# Patient Record
Sex: Male | Born: 1985 | Race: Black or African American | Hispanic: No | Marital: Single | Smoking: Current every day smoker
Health system: Southern US, Community
[De-identification: ages and names within clinical notes are randomized; demographics above are authoritative.]

## PROBLEM LIST (undated history)

## (undated) DIAGNOSIS — K8020 Calculus of gallbladder without cholecystitis without obstruction: Secondary | ICD-10-CM

## (undated) DIAGNOSIS — J45909 Unspecified asthma, uncomplicated: Secondary | ICD-10-CM

---

## 2007-04-12 ENCOUNTER — Emergency Department: Payer: Self-pay | Admitting: Emergency Medicine

## 2009-08-16 ENCOUNTER — Emergency Department: Payer: Self-pay | Admitting: Emergency Medicine

## 2010-05-10 ENCOUNTER — Emergency Department: Payer: Self-pay | Admitting: Emergency Medicine

## 2010-05-14 ENCOUNTER — Emergency Department: Payer: Self-pay | Admitting: Emergency Medicine

## 2017-04-09 ENCOUNTER — Emergency Department: Payer: Self-pay

## 2017-04-09 ENCOUNTER — Emergency Department
Admission: EM | Admit: 2017-04-09 | Discharge: 2017-04-09 | Disposition: A | Discharge status: Home / Self Care | Payer: Self-pay | Attending: Student in an Organized Health Care Education/Training Program | Admitting: Student in an Organized Health Care Education/Training Program

## 2017-04-09 DIAGNOSIS — F17200 Nicotine dependence, unspecified, uncomplicated: Secondary | ICD-10-CM | POA: Insufficient documentation

## 2017-04-09 DIAGNOSIS — J45909 Unspecified asthma, uncomplicated: Secondary | ICD-10-CM | POA: Insufficient documentation

## 2017-04-09 DIAGNOSIS — R1011 Right upper quadrant pain: Secondary | ICD-10-CM

## 2017-04-09 DIAGNOSIS — R1013 Epigastric pain: Secondary | ICD-10-CM

## 2017-04-09 HISTORY — DX: Unspecified asthma, uncomplicated: J45.909

## 2017-04-09 HISTORY — DX: Calculus of gallbladder without cholecystitis without obstruction: K80.20

## 2017-04-09 LAB — CBC WITH DIFFERENTIAL/PLATELET
BASOS ABS: 0 10*3/uL (ref 0–0.1)
BASOS PCT: 0 %
EOS ABS: 0 10*3/uL (ref 0–0.7)
Eosinophils Relative: 0 %
HEMATOCRIT: 46.5 % (ref 40.0–52.0)
Hemoglobin: 15.9 g/dL (ref 13.0–18.0)
Lymphocytes Relative: 5 %
Lymphs Abs: 0.9 10*3/uL — ABNORMAL LOW (ref 1.0–3.6)
MCH: 33 pg (ref 26.0–34.0)
MCHC: 34.2 g/dL (ref 32.0–36.0)
MCV: 96.7 fL (ref 80.0–100.0)
MONO ABS: 0.6 10*3/uL (ref 0.2–1.0)
Monocytes Relative: 3 %
NEUTROS PCT: 92 %
Neutro Abs: 17.6 10*3/uL — ABNORMAL HIGH (ref 1.4–6.5)
Platelets: 230 10*3/uL (ref 150–440)
RBC: 4.81 MIL/uL (ref 4.40–5.90)
RDW: 13.4 % (ref 11.5–14.5)
WBC: 19.1 10*3/uL — ABNORMAL HIGH (ref 3.8–10.6)

## 2017-04-09 LAB — COMPREHENSIVE METABOLIC PANEL
ALBUMIN: 4.8 g/dL (ref 3.5–5.0)
ALT: 22 U/L (ref 17–63)
AST: 43 U/L — AB (ref 15–41)
Alkaline Phosphatase: 79 U/L (ref 38–126)
Anion gap: 15 (ref 5–15)
BILIRUBIN TOTAL: 1.5 mg/dL — AB (ref 0.3–1.2)
BUN: 15 mg/dL (ref 6–20)
CHLORIDE: 104 mmol/L (ref 101–111)
CO2: 18 mmol/L — AB (ref 22–32)
Calcium: 9.5 mg/dL (ref 8.9–10.3)
Creatinine, Ser: 1.28 mg/dL — ABNORMAL HIGH (ref 0.61–1.24)
GFR calc Af Amer: >60 mL/min (ref >60)
GFR calc non Af Amer: >60 mL/min (ref >60)
GLUCOSE: 108 mg/dL — AB (ref 65–99)
POTASSIUM: 5.3 mmol/L — AB (ref 3.5–5.1)
SODIUM: 137 mmol/L (ref 135–145)
TOTAL PROTEIN: 9.4 g/dL — AB (ref 6.5–8.1)

## 2017-04-09 LAB — LIPASE, BLOOD: Lipase: 14 U/L (ref 11–51)

## 2017-04-09 MED ORDER — SODIUM CHLORIDE 0.9 % IV BOLUS (SEPSIS)
1000.0000 mL | Freq: Once | INTRAVENOUS | Status: AC
  Administered 2017-04-09: 1000 mL via INTRAVENOUS

## 2017-04-09 MED ORDER — PROMETHAZINE HCL 25 MG/ML IJ SOLN
12.5000 mg | Freq: Four times a day (QID) | INTRAMUSCULAR | Status: DC | PRN
  Administered 2017-04-09: 12.5 mg via INTRAVENOUS
  Filled 2017-04-09: qty 1

## 2017-04-09 MED ORDER — DICYCLOMINE HCL 10 MG PO CAPS: 10.0000 mg | ORAL_CAPSULE | Freq: Three times a day (TID) | ORAL | 0 refills | Status: DC | PRN

## 2017-04-09 MED ORDER — PROMETHAZINE HCL 12.5 MG PO TABS: 12.5000 mg | ORAL_TABLET | Freq: Four times a day (QID) | ORAL | 0 refills | Status: DC | PRN

## 2017-04-09 MED ORDER — MORPHINE SULFATE (PF) 4 MG/ML IV SOLN
4.0000 mg | INTRAVENOUS | Status: DC | PRN
  Administered 2017-04-09: 4 mg via INTRAVENOUS
  Filled 2017-04-09: qty 1

## 2017-04-09 MED ORDER — IOPAMIDOL (ISOVUE-300) INJECTION 61%
100.0000 mL | Freq: Once | INTRAVENOUS | Status: AC | PRN
  Administered 2017-04-09: 100 mL via INTRAVENOUS

## 2017-04-09 NOTE — ED Provider Notes (Signed)
[facility] Emergency Department Provider Note    First MD Initiated Contact with Patient 04/09/17 1655     (approximate)  I have reviewed the triage vital signs and the nursing notes.   HISTORY  Chief Complaint Abdominal Pain    HPI Roberto R Orene DesanctisSlade Jr. is a 31 y.o. male with self-reported history of gallstones roughly 1 year ago presents with chief complaint today of epigastric pain associated with nausea vomiting and pain radiating through to his back.  States the pain is moderate to severe in nature and unrelenting since last night.  Patient did drink the greater portion of 1/5 of alcohol last night.  States that he has had 5-10 episodes of nonbilious nonbloody vomiting.  Denies any history of pancreatitis.  No new medications.  No other associated pain.  No chronic comorbidities.  Past Medical History:  Diagnosis Date  . Asthma   . Gallstones    FM[facility]: no history of bleeding disorders History reviewed. No pertinent surgical history. There are no active problems to display for this patient.     Prior to Admission medications   Medication Sig Start Date End Date Taking? Authorizing Provider  dicyclomine (BENTYL) 10 MG capsule Take 1 capsule (10 mg total) by mouth 3 (three) times daily as needed for up to 14 days for spasms. 04/09/17 04/23/17  Willy Eddyobinson, [place] Forker, MD  promethazine (PHENERGAN) 12.5 MG tablet Take 1 tablet (12.5 mg total) by mouth every 6 (six) hours as needed for nausea or vomiting. 04/09/17   Willy Eddyobinson, Ariyonna Twichell, MD    Allergies Patient has no known allergies.    Social History Social History   Tobacco Use  . Smoking status: Current Every Day Smoker  . Smokeless tobacco: Never Used  Substance Use Topics  . Alcohol use: Yes    Comment: occasionally  . Drug use: Yes    Types: Marijuana    Review of Systems Patient denies headaches, rhinorrhea, blurry vision, numbness, shortness of breath, chest pain, edema, cough, abdominal  pain, nausea, vomiting, diarrhea, dysuria, fevers, rashes or hallucinations unless otherwise stated above in HPI. ____________________________________________   PHYSICAL EXAM:  VITAL SIGNS: Vitals:   04/09/17 1830 04/09/17 1947  BP: 121/69 129/78  Pulse: 86 77  Resp:  18  Temp:    SpO2: 100% 100%    Constitutional: Alert and oriented.  in no acute distress. Eyes: Conjunctivae are normal.  Head: Atraumatic. Nose: No congestion/rhinnorhea. Mouth/Throat: Mucous membranes are moist.   Neck: No stridor. Painless ROM.  Cardiovascular: Normal rate, regular rhythm. Grossly normal heart sounds.  Good peripheral circulation. Respiratory: Normal respiratory effort.  No retractions. Lungs CTAB. Gastrointestinal: Soft with tenderness to palpation in the right upper quadrant epigastric region. no distention. No abdominal bruits. No CVA tenderness. Musculoskeletal: No lower extremity tenderness nor edema.  No joint effusions. Neurologic:  Normal speech and language. No gross focal neurologic deficits are appreciated. No facial droop Skin:  Skin is warm, dry and intact. No rash noted. Psychiatric: Mood and affect are normal. Speech and behavior are normal.  ____________________________________________   LABS (all labs ordered are listed, but only abnormal results are displayed)  Results for orders placed or performed during the hospital encounter of 04/09/17 (from the past 24 hour(s))  CBC with Differential/Platelet     Status: Abnormal   Collection Time: 04/09/17  5:05 PM  Result Value Ref Range   WBC 19.1 (H) 3.8 - 10.6 K/uL   RBC 4.81 4.40 - 5.90 MIL/uL   Hemoglobin  15.9 13.0 - 18.0 g/dL   HCT 16.146.5 09.640.0 - 04.552.0 %   MCV 96.7 80.0 - 100.0 fL   MCH 33.0 26.0 - 34.0 pg   MCHC 34.2 32.0 - 36.0 g/dL   RDW 40.913.4 81.111.5 - 91.414.5 %   Platelets 230 150 - 440 K/uL   Neutrophils Relative % 92 %   Neutro Abs 17.6 (H) 1.4 - 6.5 K/uL   Lymphocytes Relative 5 %   Lymphs Abs 0.9 (L) 1.0 - 3.6 K/uL    Monocytes Relative 3 %   Monocytes Absolute 0.6 0.2 - 1.0 K/uL   Eosinophils Relative 0 %   Eosinophils Absolute 0.0 0 - 0.7 K/uL   Basophils Relative 0 %   Basophils Absolute 0.0 0 - 0.1 K/uL  Comprehensive metabolic panel     Status: Abnormal   Collection Time: 04/09/17  5:05 PM  Result Value Ref Range   Sodium 137 135 - 145 mmol/L   Potassium 5.3 (H) 3.5 - 5.1 mmol/L   Chloride 104 101 - 111 mmol/L   CO2 18 (L) 22 - 32 mmol/L   Glucose, Bld 108 (H) 65 - 99 mg/dL   BUN 15 6 - 20 mg/dL   Creatinine, Ser 7.821.28 (H) 0.61 - 1.24 mg/dL   Calcium 9.5 8.9 - 95.610.3 mg/dL   Total Protein 9.4 (H) 6.5 - 8.1 g/dL   Albumin 4.8 3.5 - 5.0 g/dL   AST 43 (H) 15 - 41 U/L   ALT 22 17 - 63 U/L   Alkaline Phosphatase 79 38 - 126 U/L   Total Bilirubin 1.5 (H) 0.3 - 1.2 mg/dL   GFR calc non Af Amer >60 >60 mL/min   GFR calc Af Amer >60 >60 mL/min   Anion gap 15 5 - 15  Lipase, blood     Status: None   Collection Time: 04/09/17  5:05 PM  Result Value Ref Range   Lipase 14 11 - 51 U/L   ____________________________________________ ______________________________  RADIOLOGY  I personally reviewed all radiographic images ordered to evaluate for the above acute complaints and reviewed radiology reports and findings.  These findings were personally discussed with the patient.  Please see medical record for radiology report.  ____________________________________________   PROCEDURES  Procedure(s) performed:  Procedures    Critical Care performed: no ____________________________________________   INITIAL IMPRESSION / ASSESSMENT AND PLAN / ED COURSE  Pertinent labs & imaging results that were available during my care of the patient were reviewed by me and considered in my medical decision making (see chart for details).  DDX: cholelithiasis, cholecystitis, pancreatitis, boerhaaves, mediastinitis, appendicitis, withdrawal, gastritis  Roberto R Orene DesanctisSlade Jr. is a 31 y.o. who presents to the ED with  severe abdominal pain and nausea as described above.  Patient did admit to drinking alcohol but does have some tenderness on exam based on his leukocytosis mildly elevated T bili will order CT imaging to evaluate for pathologies as described above.  Patient will be given IV fluids as well as IV pain medication and IV antiemetics.  Clinical Course as of Apr 09 2349  Fri Apr 09, 2017  1732 Patient with leukocytosis and abdominal pain concerning for intra-abdominal process therefore will order CT of abdomen to exclude a surgical process.  [PR]    Clinical Course User Index [PR] Willy Eddyobinson, Jontavious Commons, MD   Right upper quadrant ultrasound ordered as a follow-up to CT scan as above.  CT was without any evidence of acute intra-abdominal process but given his upper  quadrant pain with history of stones ultrasound was ordered which shows no acute pathology.  Patient was given 2 L of IV fluids and a p.o. challenge for which the patient was able to tolerate.  Symptoms resolved.  Most likely consistent with alcoholic gastritis.  Discussed strict return precautions.  ____________________________________________   FINAL CLINICAL IMPRESSION(S) / ED DIAGNOSES  Final diagnoses:  RUQ pain  Epigastric pain      NEW MEDICATIONS STARTED DURING THIS VISIT:  This SmartLink is deprecated. Use AVSMEDLIST instead to display the medication list for a patient.   Note:  This document was prepared using Dragon voice recognition software and may include unintentional dictation errors.    Willy Eddy, MD 04/09/17 2350

## 2017-04-09 NOTE — Discharge Instructions (Signed)

## 2017-04-09 NOTE — ED Triage Notes (Signed)
Pt arrived via [facility] from home.  Pt having RUQ pain with history of gallstones 1 year ago.  Pt states that he did not follow up with surgery because he was incarcerated after diagnosis.  Pt reports that he did not eat yesterday, but reports drinking approx 1/2 to 2/3 fifth of alcohol last night.  Pt having pain since this AM and reports vomiting yellow substance 5-10 times.  Pt denies diarrhea and denies nausea at this time, but states that the pain is still present.  Pt is A&Ox4.

## 2017-04-09 NOTE — ED Notes (Signed)
Pt vomited x1, clear liquid approx 250cc.  Pt denies drinking any water or other fluids PTA.

## 2017-04-09 NOTE — ED Notes (Signed)
Pt discharged to home.  Family member driving.  Discharge instructions reviewed.  Verbalized understanding.  No questions or concerns at this time.  Teach back verified.  Pt in NAD.  No items left in ED.   

## 2018-03-07 ENCOUNTER — Emergency Department: Admission: EM | Admit: 2018-03-07 | Discharge: 2018-03-07 | Discharge status: Against Medical Advice | Payer: Self-pay

## 2018-03-07 NOTE — ED Triage Notes (Signed)
Pt states he does not want to be seen. Pt reports only came because he has missed work the last few days and wants a work note but does not want to wait and see anyone. Encouraged pt to stay and be seen by a MD but pt refuses.

## 2018-07-16 ENCOUNTER — Encounter: Payer: Self-pay | Admitting: Emergency Medicine

## 2018-07-16 ENCOUNTER — Other Ambulatory Visit: Payer: Self-pay

## 2018-07-16 ENCOUNTER — Emergency Department
Admission: EM | Admit: 2018-07-16 | Discharge: 2018-07-16 | Disposition: A | Discharge status: Home / Self Care | Payer: Self-pay | Attending: Emergency Medicine | Admitting: Emergency Medicine

## 2018-07-16 DIAGNOSIS — R1110 Vomiting, unspecified: Secondary | ICD-10-CM

## 2018-07-16 DIAGNOSIS — R112 Nausea with vomiting, unspecified: Secondary | ICD-10-CM | POA: Insufficient documentation

## 2018-07-16 DIAGNOSIS — J45909 Unspecified asthma, uncomplicated: Secondary | ICD-10-CM | POA: Insufficient documentation

## 2018-07-16 DIAGNOSIS — F17200 Nicotine dependence, unspecified, uncomplicated: Secondary | ICD-10-CM | POA: Insufficient documentation

## 2018-07-16 LAB — COMPREHENSIVE METABOLIC PANEL
ALK PHOS: 60 U/L (ref 38–126)
ALT: 13 U/L (ref 0–44)
AST: 20 U/L (ref 15–41)
Albumin: 4.5 g/dL (ref 3.5–5.0)
Anion gap: 8 (ref 5–15)
BILIRUBIN TOTAL: 1.2 mg/dL (ref 0.3–1.2)
BUN: 6 mg/dL (ref 6–20)
CALCIUM: 9.2 mg/dL (ref 8.9–10.3)
CO2: 28 mmol/L (ref 22–32)
CREATININE: 1.06 mg/dL (ref 0.61–1.24)
Chloride: 102 mmol/L (ref 98–111)
GFR calc Af Amer: >60 mL/min (ref >60)
GFR calc non Af Amer: >60 mL/min (ref >60)
GLUCOSE: 151 mg/dL — AB (ref 70–99)
Potassium: 3.9 mmol/L (ref 3.5–5.1)
Sodium: 138 mmol/L (ref 135–145)
Total Protein: 8.6 g/dL — ABNORMAL HIGH (ref 6.5–8.1)

## 2018-07-16 LAB — TROPONIN I: Troponin I: <0.03 ng/mL (ref <0.03)

## 2018-07-16 LAB — URINALYSIS, COMPLETE (UACMP) WITH MICROSCOPIC
Bacteria, UA: NONE SEEN
Bilirubin Urine: NEGATIVE
Glucose, UA: >=500 mg/dL — AB
Hgb urine dipstick: NEGATIVE
KETONES UR: 80 mg/dL — AB
Leukocytes,Ua: NEGATIVE
Nitrite: NEGATIVE
PH: 8 (ref 5.0–8.0)
PROTEIN: 30 mg/dL — AB
Specific Gravity, Urine: 1.017 (ref 1.005–1.030)

## 2018-07-16 LAB — CBC
HCT: 45.1 % (ref 39.0–52.0)
Hemoglobin: 15.7 g/dL (ref 13.0–17.0)
MCH: 33.1 pg (ref 26.0–34.0)
MCHC: 34.8 g/dL (ref 30.0–36.0)
MCV: 94.9 fL (ref 80.0–100.0)
PLATELETS: 176 10*3/uL (ref 150–400)
RBC: 4.75 MIL/uL (ref 4.22–5.81)
RDW: 12.4 % (ref 11.5–15.5)
WBC: 8.5 10*3/uL (ref 4.0–10.5)
nRBC: 0 % (ref 0.0–0.2)

## 2018-07-16 LAB — LIPASE, BLOOD: Lipase: 23 U/L (ref 11–51)

## 2018-07-16 MED ORDER — SODIUM CHLORIDE 0.9 % IV BOLUS
1000.0000 mL | Freq: Once | INTRAVENOUS | Status: AC
  Administered 2018-07-16: 1000 mL via INTRAVENOUS

## 2018-07-16 MED ORDER — SODIUM CHLORIDE 0.9% FLUSH: 3.0000 mL | Freq: Once | INTRAVENOUS | Status: DC

## 2018-07-16 MED ORDER — SODIUM CHLORIDE 0.9 % IV BOLUS: 1000.0000 mL | Freq: Once | INTRAVENOUS | Status: DC

## 2018-07-16 MED ORDER — DICYCLOMINE HCL 20 MG PO TABS: 20.0000 mg | ORAL_TABLET | Freq: Three times a day (TID) | ORAL | 0 refills | Status: DC | PRN

## 2018-07-16 MED ORDER — ONDANSETRON HCL 4 MG/2ML IJ SOLN
4.0000 mg | Freq: Once | INTRAMUSCULAR | Status: AC
  Administered 2018-07-16: 4 mg via INTRAVENOUS
  Filled 2018-07-16: qty 2

## 2018-07-16 MED ORDER — ONDANSETRON HCL 4 MG PO TABS: 4.0000 mg | ORAL_TABLET | Freq: Three times a day (TID) | ORAL | 0 refills | Status: DC | PRN

## 2018-07-16 MED ORDER — ONDANSETRON HCL 4 MG/2ML IJ SOLN: 4.0000 mg | Freq: Once | INTRAMUSCULAR | Status: DC

## 2018-07-16 MED ORDER — DICYCLOMINE HCL 10 MG PO CAPS
20.0000 mg | ORAL_CAPSULE | Freq: Once | ORAL | Status: AC
  Administered 2018-07-16: 20 mg via ORAL
  Filled 2018-07-16: qty 2

## 2018-07-16 MED ORDER — ONDANSETRON 4 MG PO TBDP
4.0000 mg | ORAL_TABLET | Freq: Once | ORAL | Status: AC
  Administered 2018-07-16: 4 mg via ORAL
  Filled 2018-07-16: qty 1

## 2018-07-16 NOTE — ED Notes (Signed)
flex 

## 2018-07-16 NOTE — ED Triage Notes (Signed)
Nausea, vomiting and epigastric pain began this am.

## 2018-07-16 NOTE — Discharge Instructions (Addendum)
Please seek medical attention for any high fevers, chest pain, shortness of breath, change in behavior, persistent vomiting, bloody stool or any other new or concerning symptoms.  

## 2018-07-16 NOTE — ED Provider Notes (Signed)
[facility] Emergency Department Provider Note   ____________________________________________   I have reviewed the triage vital signs and the nursing notes.   HISTORY  Chief Complaint Emesis and Abdominal Pain   History limited by: Not Limited   HPI Roberto R Bella Ohmann. is a 33 y.o. male who presents to the emergency department today because of concerns for nausea and vomiting as well as abdominal pain.  He states his symptoms started this morning.  They woke him from sleep.  He has had multiple episodes of nonbloody emesis.  It has been accompanied by sharp lower abdominal pain.  He has noticed some diarrhea although not as much as he said he would expect.  He denies any bloody or black or tarry stool.  Denies any unusual ingestions.  States that he at one point in his life had gallstones although he did not have any a year and a half ago.  Denies any fevers.   Per medical record review patient has a history of gallstones, US performed roughly 1.5 years ago with normal gallbladder.   Past Medical History:  Diagnosis Date  . Asthma   . Gallstones     There are no active problems to display for this patient.   History reviewed. No pertinent surgical history.  Prior to Admission medications   Medication Sig Start Date End Date Taking? Authorizing Provider  dicyclomine (BENTYL) 10 MG capsule Take 1 capsule (10 mg total) by mouth 3 (three) times daily as needed for up to 14 days for spasms. 04/09/17 04/23/17  Willy Eddy, MD  promethazine (PHENERGAN) 12.5 MG tablet Take 1 tablet (12.5 mg total) by mouth every 6 (six) hours as needed for nausea or vomiting. 04/09/17   Willy Eddy, MD    Allergies Patient has no known allergies.  No family history on file.  Social History Social History   Tobacco Use  . Smoking status: Current Every Day Smoker  . Smokeless tobacco: Never Used  Substance Use Topics  . Alcohol use: Yes    Comment:  occasionally  . Drug use: Yes    Types: Marijuana    Review of Systems Constitutional: No fever/chills Eyes: No visual changes. ENT: No sore throat. Cardiovascular: Denies chest pain. Respiratory: Denies shortness of breath. Gastrointestinal: Positive for nausea and vomiting.  Genitourinary: Negative for dysuria. Musculoskeletal: Negative for back pain. Skin: Negative for rash. Neurological: Negative for headaches, focal weakness or numbness.  ____________________________________________   PHYSICAL EXAM:  VITAL SIGNS: ED Triage Vitals  Enc Vitals Group     BP 07/16/18 1215 (!) 141/98     Pulse Rate 07/16/18 1215 61     Resp 07/16/18 1215 18     Temp 07/16/18 1215 (!) 97.5 F (36.4 C)     Temp Source 07/16/18 1215 Oral     SpO2 07/16/18 1215 100 %     Weight 07/16/18 1216 175 lb (79.4 kg)     Height 07/16/18 1216 5\' 6"  (1.676 m)     Head Circumference --      Peak Flow --      Pain Score 07/16/18 1216 9   Constitutional: Alert and oriented.  Eyes: Conjunctivae are normal.  ENT      Head: Normocephalic and atraumatic.      Nose: No congestion/rhinnorhea.      Mouth/Throat: Mucous membranes are moist.      Neck: No stridor. Hematological/Lymphatic/Immunilogical: No cervical lymphadenopathy. Cardiovascular: Normal rate, regular rhythm.  No murmurs, rubs, or gallops.  Respiratory: Normal respiratory effort without tachypnea nor retractions. Breath sounds are clear and equal bilaterally. No wheezes/rales/rhonchi. Gastrointestinal: Soft and non tender. No rebound. No guarding.  Genitourinary: Deferred Musculoskeletal: Normal range of motion in all extremities. No lower extremity edema. Neurologic:  Normal speech and language. No gross focal neurologic deficits are appreciated.  Skin:  Skin is warm, dry and intact. No rash noted. Psychiatric: Mood and affect are normal. Speech and behavior are normal. Patient exhibits appropriate insight and  judgment.  ____________________________________________    LABS (pertinent positives/negatives)  Trop <0.03 Lipase 23 CBC wbc 8.5, hgb 15.7, plt 176 CMP wnl except glu 151, t pro 8.6  ____________________________________________   EKG  I, Phineas Semen, attending physician, personally viewed and interpreted this EKG  EKG Time: 1227 Rate: 56 Rhythm: sinus bradycardia Axis: normal Intervals: qtc 384 QRS: narrow ST changes: no st elevation Impression: abnormal ekg   ____________________________________________    RADIOLOGY  None  ____________________________________________   PROCEDURES  Procedures  ____________________________________________   INITIAL IMPRESSION / ASSESSMENT AND PLAN / ED COURSE  Pertinent labs & imaging results that were available during my care of the patient were reviewed by me and considered in my medical decision making (see chart for details).   Patient presented to the emergency department today because of concern for nausea, vomiting and abdominal pain. Did have some diarrhea. Blood work leukocytosis concerning for severe intraabdominal infection. Additionally lipase and lfts without concerning findings. Patient did feel better after iv fluids and medication. Was able to tolerate PO. Discussed with patient at this point think likely gastroenteritis vs food poisoning. Will discharge with antiemetics and bentyl.   ____________________________________________   FINAL CLINICAL IMPRESSION(S) / ED DIAGNOSES  Final diagnoses:  Vomiting, intractability of vomiting not specified, presence of nausea not specified, unspecified vomiting type     Note: This dictation was prepared with Dragon dictation. Any transcriptional errors that result from this process are unintentional     Phineas Semen, MD 07/16/18 [phone]

## 2018-07-18 ENCOUNTER — Other Ambulatory Visit: Payer: Self-pay

## 2018-07-18 ENCOUNTER — Emergency Department: Payer: Self-pay

## 2018-07-18 ENCOUNTER — Emergency Department
Admission: EM | Admit: 2018-07-18 | Discharge: 2018-07-18 | Disposition: A | Discharge status: Home / Self Care | Payer: Self-pay | Attending: Emergency Medicine | Admitting: Emergency Medicine

## 2018-07-18 DIAGNOSIS — J45909 Unspecified asthma, uncomplicated: Secondary | ICD-10-CM | POA: Insufficient documentation

## 2018-07-18 DIAGNOSIS — K2900 Acute gastritis without bleeding: Secondary | ICD-10-CM | POA: Insufficient documentation

## 2018-07-18 DIAGNOSIS — F17200 Nicotine dependence, unspecified, uncomplicated: Secondary | ICD-10-CM | POA: Insufficient documentation

## 2018-07-18 LAB — CBC
HCT: 44.9 % (ref 39.0–52.0)
HEMOGLOBIN: 15.6 g/dL (ref 13.0–17.0)
MCH: 33 pg (ref 26.0–34.0)
MCHC: 34.7 g/dL (ref 30.0–36.0)
MCV: 94.9 fL (ref 80.0–100.0)
NRBC: 0 % (ref 0.0–0.2)
Platelets: 220 10*3/uL (ref 150–400)
RBC: 4.73 MIL/uL (ref 4.22–5.81)
RDW: 12.5 % (ref 11.5–15.5)
WBC: 9.4 10*3/uL (ref 4.0–10.5)

## 2018-07-18 LAB — LIPASE, BLOOD: Lipase: 24 U/L (ref 11–51)

## 2018-07-18 LAB — COMPREHENSIVE METABOLIC PANEL
ALBUMIN: 4.5 g/dL (ref 3.5–5.0)
ALT: 14 U/L (ref 0–44)
AST: 23 U/L (ref 15–41)
Alkaline Phosphatase: 53 U/L (ref 38–126)
Anion gap: 10 (ref 5–15)
BILIRUBIN TOTAL: 1.2 mg/dL (ref 0.3–1.2)
BUN: 10 mg/dL (ref 6–20)
CO2: 30 mmol/L (ref 22–32)
Calcium: 9.2 mg/dL (ref 8.9–10.3)
Chloride: 99 mmol/L (ref 98–111)
Creatinine, Ser: 1.13 mg/dL (ref 0.61–1.24)
Glucose, Bld: 125 mg/dL — ABNORMAL HIGH (ref 70–99)
POTASSIUM: 3.3 mmol/L — AB (ref 3.5–5.1)
Sodium: 139 mmol/L (ref 135–145)
TOTAL PROTEIN: 8.3 g/dL — AB (ref 6.5–8.1)

## 2018-07-18 MED ORDER — PANTOPRAZOLE SODIUM 40 MG IV SOLR
40.0000 mg | Freq: Once | INTRAVENOUS | Status: AC
  Administered 2018-07-18: 40 mg via INTRAVENOUS
  Filled 2018-07-18: qty 40

## 2018-07-18 MED ORDER — PANTOPRAZOLE SODIUM 20 MG PO TBEC: 20.0000 mg | DELAYED_RELEASE_TABLET | Freq: Every day | ORAL | 1 refills | Status: DC

## 2018-07-18 MED ORDER — MORPHINE SULFATE (PF) 4 MG/ML IV SOLN
INTRAVENOUS | Status: AC
  Administered 2018-07-18: 4 mg via INTRAVENOUS
  Filled 2018-07-18: qty 1

## 2018-07-18 MED ORDER — ONDANSETRON 4 MG PO TBDP: 4.0000 mg | ORAL_TABLET | Freq: Three times a day (TID) | ORAL | 0 refills | Status: DC | PRN

## 2018-07-18 MED ORDER — MORPHINE SULFATE (PF) 4 MG/ML IV SOLN
4.0000 mg | Freq: Once | INTRAVENOUS | Status: AC
Start: 2018-07-18 — End: 2018-07-18
  Administered 2018-07-18: 4 mg via INTRAVENOUS

## 2018-07-18 MED ORDER — SUCRALFATE 1 G PO TABS: 1.0000 g | ORAL_TABLET | Freq: Four times a day (QID) | ORAL | 1 refills | Status: DC

## 2018-07-18 MED ORDER — ONDANSETRON HCL 4 MG/2ML IJ SOLN
INTRAMUSCULAR | Status: AC
  Administered 2018-07-18: 4 mg via INTRAVENOUS
  Filled 2018-07-18: qty 2

## 2018-07-18 MED ORDER — ONDANSETRON HCL 4 MG/2ML IJ SOLN
4.0000 mg | Freq: Once | INTRAMUSCULAR | Status: AC
  Administered 2018-07-18: 4 mg via INTRAVENOUS

## 2018-07-18 NOTE — ED Provider Notes (Signed)
[facility] Emergency Department Provider Note   ____________________________________________    I have reviewed the triage vital signs and the nursing notes.   HISTORY  Chief Complaint Hematemesis and Abdominal Pain     HPI Roberto Hill. is a 33 y.o. male who presents with complaints of nausea vomiting and abdominal pain.  Patient describes periumbilical abdominal pain with frequent episodes of nausea and vomiting.  Reports symptoms have been going going for several days, seen the emergency department 2 days ago for similar complaints.  Denies fevers or chills.  Returns today because he felt like he saw small amount of blood in his vomitus.  No history of abdominal surgery.  Past Medical History:  Diagnosis Date  . Asthma   . Gallstones     There are no active problems to display for this patient.   History reviewed. No pertinent surgical history.  Prior to Admission medications   Medication Sig Start Date End Date Taking? Authorizing Provider  dicyclomine (BENTYL) 10 MG capsule Take 1 capsule (10 mg total) by mouth 3 (three) times daily as needed for up to 14 days for spasms. 04/09/17 04/23/17  Willy Eddy, MD  dicyclomine (BENTYL) 20 MG tablet Take 1 tablet (20 mg total) by mouth 3 (three) times daily as needed (abdominal pain). 07/16/18   Phineas Semen, MD  ondansetron (ZOFRAN ODT) 4 MG disintegrating tablet Take 1 tablet (4 mg total) by mouth every 8 (eight) hours as needed. 07/18/18   Jene Every, MD  ondansetron (ZOFRAN) 4 MG tablet Take 1 tablet (4 mg total) by mouth every 8 (eight) hours as needed. 07/16/18   Phineas Semen, MD  pantoprazole (PROTONIX) 20 MG tablet Take 1 tablet (20 mg total) by mouth daily. 07/18/18 07/18/19  Jene Every, MD  promethazine (PHENERGAN) 12.5 MG tablet Take 1 tablet (12.5 mg total) by mouth every 6 (six) hours as needed for nausea or vomiting. 04/09/17   Willy Eddy, MD  sucralfate (CARAFATE) 1  g tablet Take 1 tablet (1 g total) by mouth 4 (four) times daily for 20 days. 07/18/18 08/07/18  Jene Every, MD     Allergies Patient has no known allergies.  No family history on file.  Social History Social History   Tobacco Use  . Smoking status: Current Every Day Smoker  . Smokeless tobacco: Never Used  Substance Use Topics  . Alcohol use: Yes    Comment: occasionally  . Drug use: Yes    Types: Marijuana    Review of Systems  Constitutional: No fever/chills Eyes: No visual changes.  ENT: No sore throat. Cardiovascular: Denies chest pain. Respiratory: Denies shortness of breath. Gastrointestinal: As above Genitourinary: Negative for dysuria. Musculoskeletal: Negative for back pain. Skin: Negative for rash. Neurological: Negative for headaches    ____________________________________________   PHYSICAL EXAM:  VITAL SIGNS: ED Triage Vitals [07/18/18 1234]  Enc Vitals Group     BP 112/69     Pulse Rate (!) 49     Resp 18     Temp 98.2 F (36.8 C)     Temp Source Oral     SpO2 100 %     Weight 79 kg (174 lb 2.6 oz)     Height 1.676 m (5\' 6" )     Head Circumference      Peak Flow      Pain Score 8     Pain Loc      Pain Edu?  Excl. in GC?     Constitutional: Alert and oriented. No acute distress.   Nose: No congestion/rhinnorhea. Mouth/Throat: Mucous membranes are moist.    Cardiovascular: Normal rate, regular rhythm. Grossly normal heart sounds.  Good peripheral circulation. Respiratory: Normal respiratory effort.  No retractions. Lungs CTAB. Gastrointestinal: Soft, minimal tenderness periumbilically, no CVA tenderness no distention  Musculoskeletal  Warm and well perfused Neurologic:  Normal speech and language. No gross focal neurologic deficits are appreciated.  Skin:  Skin is warm, dry and intact. No rash noted. Psychiatric: Mood and affect are normal. Speech and behavior are normal.  ____________________________________________     LABS (all labs ordered are listed, but only abnormal results are displayed)  Labs Reviewed  COMPREHENSIVE METABOLIC PANEL - Abnormal; Notable for the following components:      Result Value   Potassium 3.3 (*)    Glucose, Bld 125 (*)    Total Protein 8.3 (*)    All other components within normal limits  LIPASE, BLOOD  CBC  URINALYSIS, COMPLETE (UACMP) WITH MICROSCOPIC   ____________________________________________  EKG  None ____________________________________________  RADIOLOGY  CT study negative for acute normalities ____________________________________________   PROCEDURES  Procedure(s) performed: No  Procedures   Critical Care performed: No ____________________________________________   INITIAL IMPRESSION / ASSESSMENT AND PLAN / ED COURSE  Pertinent labs & imaging results that were available during my care of the patient were reviewed by me and considered in my medical decision making (see chart for details).  Patient presents with periumbilical abdominal pain nausea and vomiting as described above.  Will treat with IV fluids, IV Zofran IV morphine, IV Protonix and reevaluate  Patient reports improvement in pain however now he has more pain in the right lower quadrant, obtain imaging which was negative for appendicitis.  Patient feeling better, no further vomiting.  We will start him on Protonix, Carafate, Zofran, outpatient follow-up, strict return precautions discussed    ____________________________________________   FINAL CLINICAL IMPRESSION(S) / ED DIAGNOSES  Final diagnoses:  Acute gastritis without hemorrhage, unspecified gastritis type        Note:  This document was prepared using Dragon voice recognition software and may include unintentional dictation errors.   Jene Every, MD 07/18/18 [phone]

## 2018-07-18 NOTE — ED Triage Notes (Signed)
Abdominal pain X 2 days, reporting emesis with dark red blood today. Mild diarrhea. Pt alert and oriented X4, active, cooperative, pt in NAD. RR even and unlabored, color WNL.

## 2019-10-13 IMAGING — CT CT RENAL STONE PROTOCOL
3 of 4 series · 8 of 46 positions shown, 15 images · non-contrast
Comparison: April 09, 2017

CLINICAL DATA: Right-sided abdominal pain for 2 days. Emesis with
dark red blood today. Mild diarrhea.

EXAM:
CT ABDOMEN AND PELVIS WITHOUT CONTRAST
TECHNIQUE: Multidetector CT imaging of the abdomen and pelvis was performed
following the standard protocol without IV contrast.

[Series 4: lung bases · axial · 0.69mm/px · z∈[-160,-100]mm · 4 of 22 slices shown, 9 images]
[im 5/22  soft-tissue]
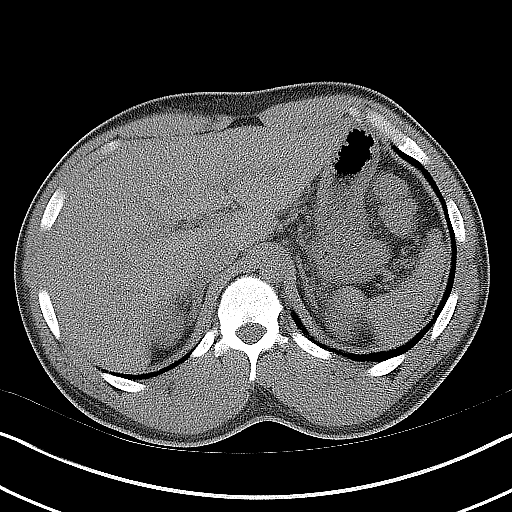
[im 5/22  lung]
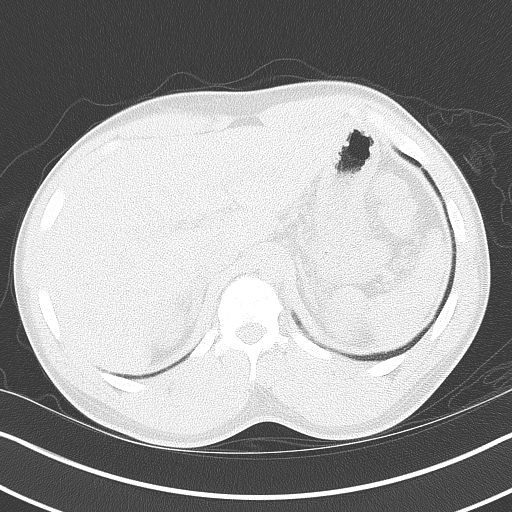
[im 5/22  bone]
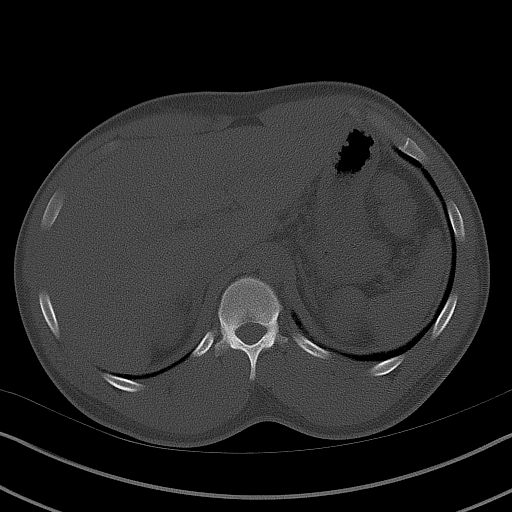
[im 9/22  soft-tissue]
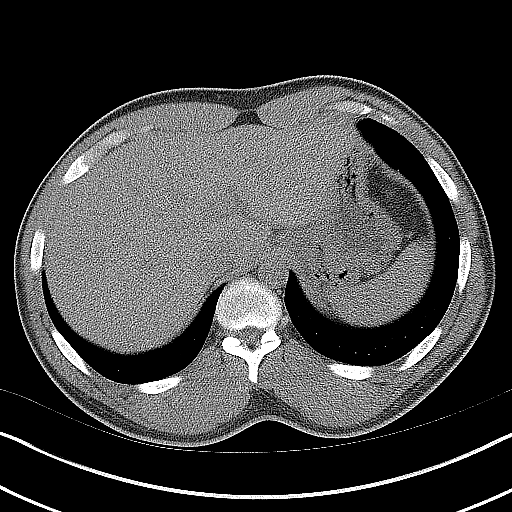
[im 9/22  lung]
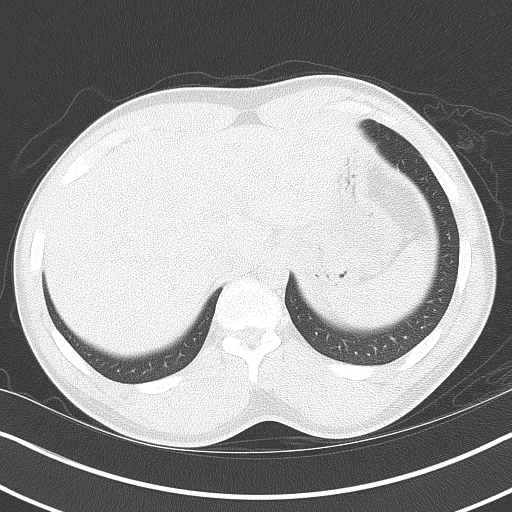
[im 13/22  soft-tissue]
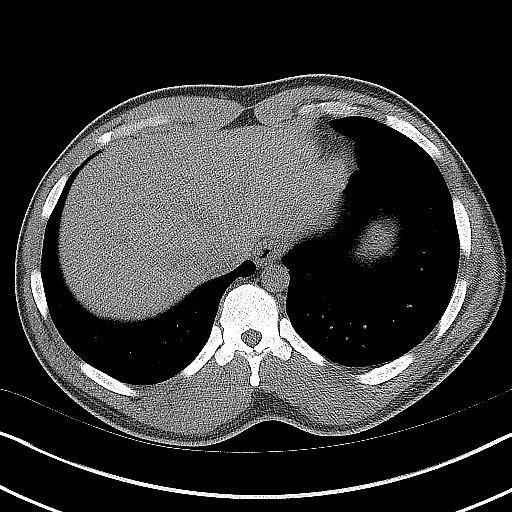
[im 13/22  lung]
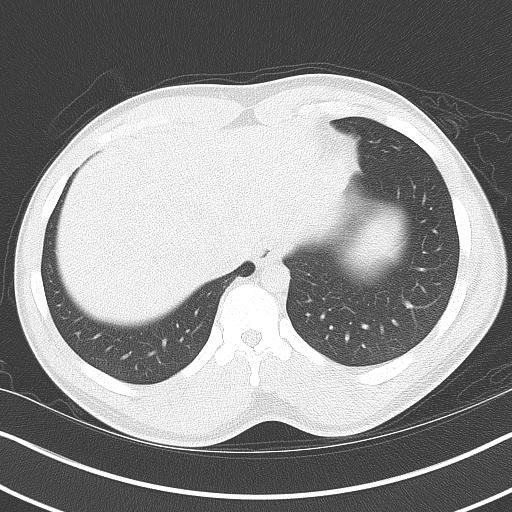
[im 17/22  soft-tissue]
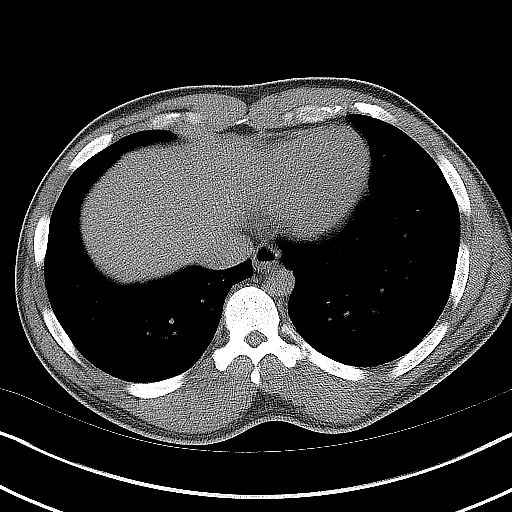
[im 17/22  lung]
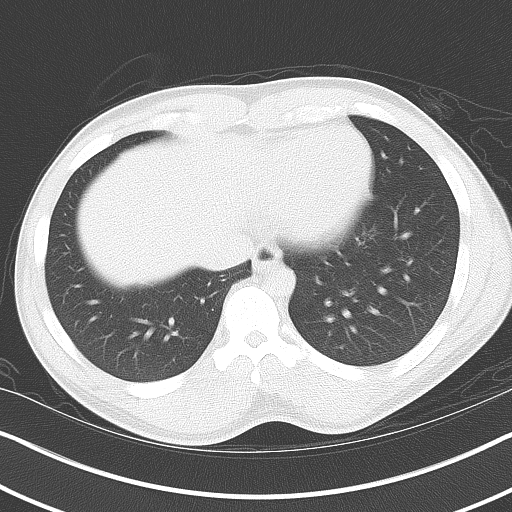

[Series 5: coronal · coronal · 0.63mm/px · 3 of 123 slices shown, 4 images]
[im 41/123  soft-tissue]
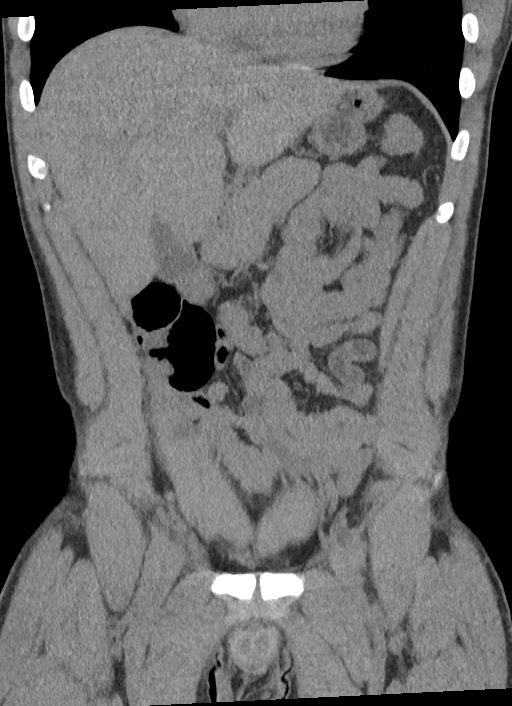
[im 55/123  soft-tissue]
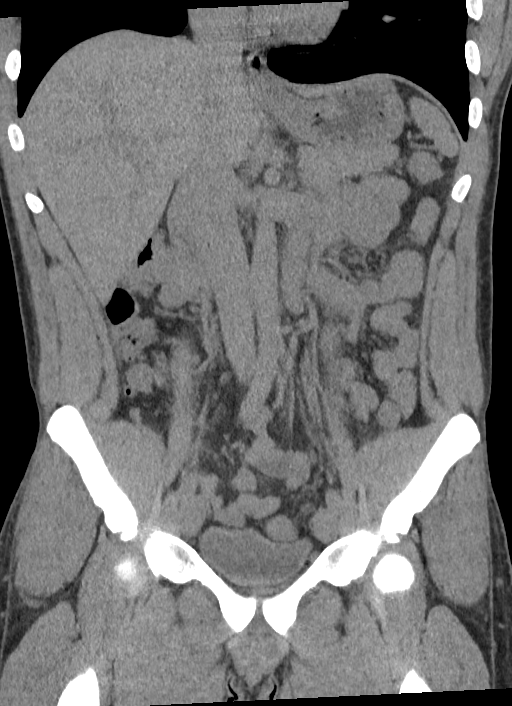
[im 55/123  bone]
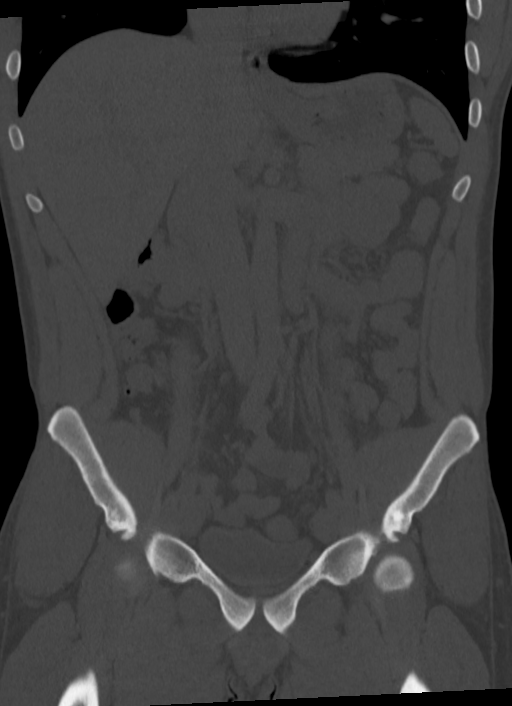
[im 68/123  soft-tissue]
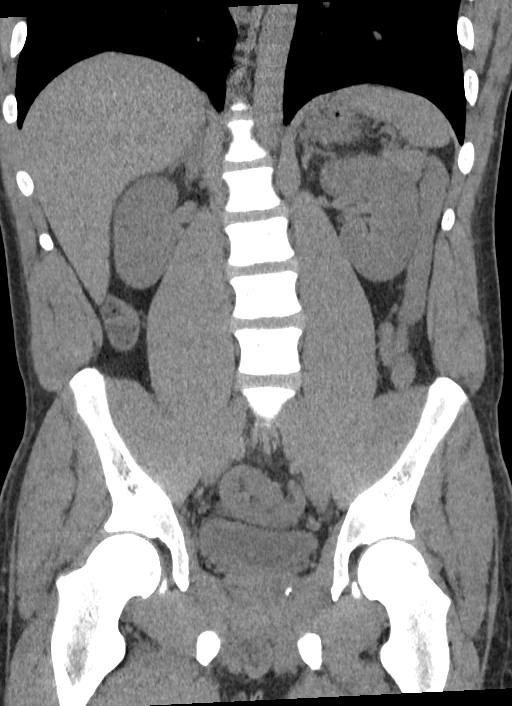

[Series 6: sagittal · sagittal · 0.48mm/px · 1 of 163 slices shown, 2 images]
[im 55/163  soft-tissue]
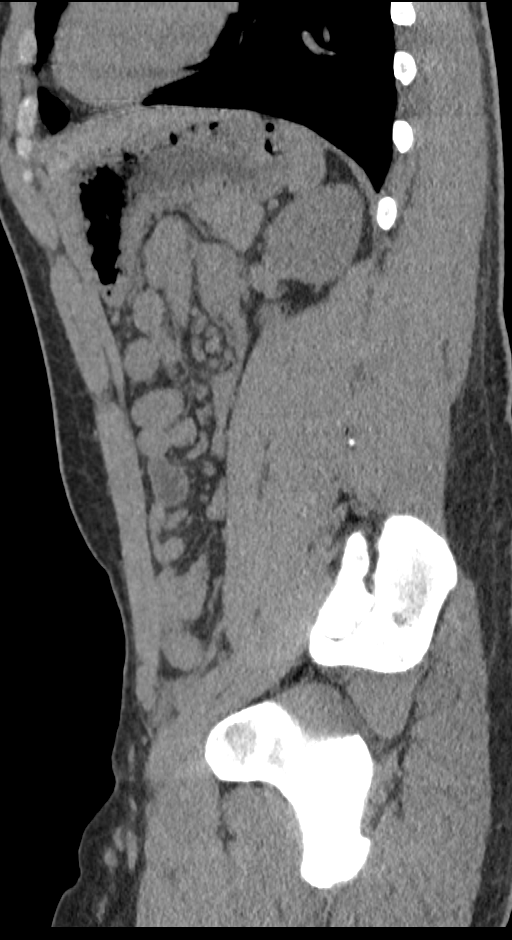
[im 55/163  bone]
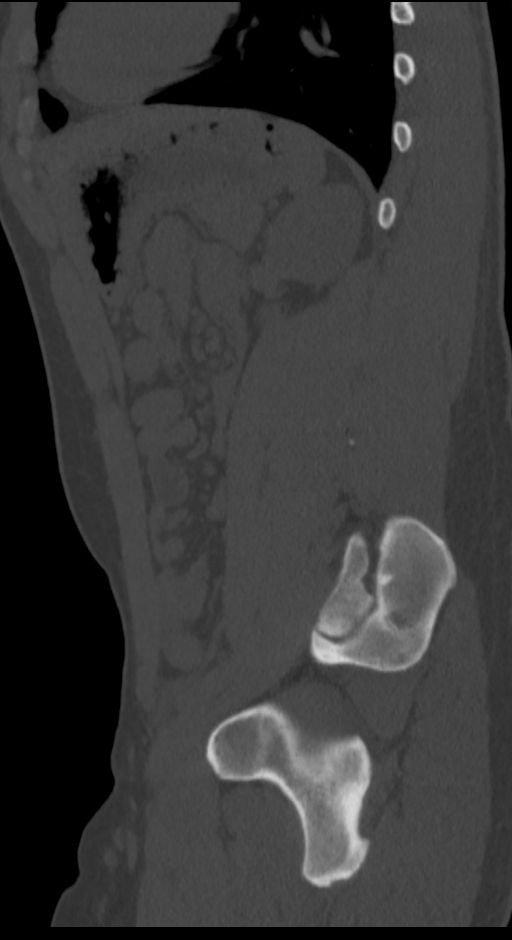

[8 of 46 positions shown; findings below may reference images not displayed]

FINDINGS: Lower chest: No acute abnormality.

Hepatobiliary: No focal liver abnormality is seen. No gallstones,
gallbladder wall thickening, or biliary dilatation.

Pancreas: Unremarkable. No pancreatic ductal dilatation or
surrounding inflammatory changes.

Spleen: Normal in size without focal abnormality.

Adrenals/Urinary Tract: Adrenal glands are unremarkable. Kidneys are
normal, without renal calculi, focal lesion, or hydronephrosis.
Bladder is unremarkable.

Stomach/Bowel: Stomach is within normal limits. Appendix appears
normal. No evidence of bowel wall thickening, distention, or
inflammatory changes.

Vascular/Lymphatic: No significant vascular findings are present. No
enlarged abdominal or pelvic lymph nodes.

Reproductive: Prostate is unremarkable.

Other: No abdominal wall hernia or abnormality. No abdominopelvic
ascites.

Musculoskeletal: No acute or significant osseous findings.
IMPRESSION: 1. No acute abnormality to explain the patient's right-sided
abdominal pain.

## 2019-10-20 ENCOUNTER — Emergency Department
Admission: EM | Admit: 2019-10-20 | Discharge: 2019-10-21 | Disposition: A | Discharge status: Home / Self Care | Payer: Self-pay | Attending: Emergency Medicine | Admitting: Emergency Medicine

## 2019-10-20 ENCOUNTER — Other Ambulatory Visit: Payer: Self-pay

## 2019-10-20 DIAGNOSIS — F17210 Nicotine dependence, cigarettes, uncomplicated: Secondary | ICD-10-CM | POA: Insufficient documentation

## 2019-10-20 DIAGNOSIS — R109 Unspecified abdominal pain: Secondary | ICD-10-CM

## 2019-10-20 DIAGNOSIS — R1084 Generalized abdominal pain: Secondary | ICD-10-CM | POA: Insufficient documentation

## 2019-10-20 LAB — COMPREHENSIVE METABOLIC PANEL
ALT: 12 U/L (ref 0–44)
AST: 18 U/L (ref 15–41)
Albumin: 4.5 g/dL (ref 3.5–5.0)
Alkaline Phosphatase: 52 U/L (ref 38–126)
Anion gap: 6 (ref 5–15)
BUN: 8 mg/dL (ref 6–20)
CO2: 29 mmol/L (ref 22–32)
Calcium: 9 mg/dL (ref 8.9–10.3)
Chloride: 103 mmol/L (ref 98–111)
Creatinine, Ser: 0.94 mg/dL (ref 0.61–1.24)
GFR calc Af Amer: >60 mL/min (ref >60)
GFR calc non Af Amer: >60 mL/min (ref >60)
Glucose, Bld: 119 mg/dL — ABNORMAL HIGH (ref 70–99)
Potassium: 3.8 mmol/L (ref 3.5–5.1)
Sodium: 138 mmol/L (ref 135–145)
Total Bilirubin: 1.3 mg/dL — ABNORMAL HIGH (ref 0.3–1.2)
Total Protein: 8 g/dL (ref 6.5–8.1)

## 2019-10-20 LAB — URINALYSIS, COMPLETE (UACMP) WITH MICROSCOPIC
Bacteria, UA: NONE SEEN
Bilirubin Urine: NEGATIVE
Glucose, UA: 50 mg/dL — AB
Hgb urine dipstick: NEGATIVE
Ketones, ur: 20 mg/dL — AB
Leukocytes,Ua: NEGATIVE
Nitrite: NEGATIVE
Protein, ur: NEGATIVE mg/dL
Specific Gravity, Urine: 1.019 (ref 1.005–1.030)
pH: 7 (ref 5.0–8.0)

## 2019-10-20 LAB — CBC
HCT: 40.9 % (ref 39.0–52.0)
Hemoglobin: 14.7 g/dL (ref 13.0–17.0)
MCH: 33.7 pg (ref 26.0–34.0)
MCHC: 35.9 g/dL (ref 30.0–36.0)
MCV: 93.8 fL (ref 80.0–100.0)
Platelets: 203 10*3/uL (ref 150–400)
RBC: 4.36 MIL/uL (ref 4.22–5.81)
RDW: 13 % (ref 11.5–15.5)
WBC: 9 10*3/uL (ref 4.0–10.5)
nRBC: 0 % (ref 0.0–0.2)

## 2019-10-20 LAB — LIPASE, BLOOD: Lipase: 22 U/L (ref 11–51)

## 2019-10-20 MED ORDER — SODIUM CHLORIDE 0.9% FLUSH: 3.0000 mL | Freq: Once | INTRAVENOUS | Status: DC

## 2019-10-20 MED ORDER — DICYCLOMINE HCL 10 MG PO CAPS: 10.0000 mg | ORAL_CAPSULE | Freq: Three times a day (TID) | ORAL | 0 refills | Status: DC | PRN

## 2019-10-20 MED ORDER — SODIUM CHLORIDE 0.9 % IV BOLUS
1000.0000 mL | Freq: Once | INTRAVENOUS | Status: AC
  Administered 2019-10-20: 1000 mL via INTRAVENOUS

## 2019-10-20 MED ORDER — HALOPERIDOL LACTATE 5 MG/ML IJ SOLN
5.0000 mg | Freq: Once | INTRAMUSCULAR | Status: AC
  Administered 2019-10-20: 5 mg via INTRAVENOUS
  Filled 2019-10-20: qty 1

## 2019-10-20 NOTE — ED Notes (Signed)
Pt c/o nausea/vomiting today. Reports dec urination and no BM today as he hasn't been able to keep anything down. Pt tender at L and R abdomen. Soft. C/o being cold. Has warm blankets. Room temp adjusted. Denies fever. Resting calmly in bed.

## 2019-10-20 NOTE — Discharge Instructions (Addendum)
Please seek medical attention for any high fevers, chest pain, shortness of breath, change in behavior, persistent vomiting, bloody stool or any other new or concerning symptoms.  

## 2019-10-20 NOTE — ED Provider Notes (Signed)
[facility] Emergency Department Provider Note  ____________________________________________   I have reviewed the triage vital signs and the nursing notes.   HISTORY  Chief Complaint Abdominal Pain   History limited by: Not Limited   HPI Roberto Hill. is a 34 y.o. male who presents to the emergency department today because of concerns for abdominal pain.  He states that it started this morning.  Has been constant.  He has had associated nausea with some vomiting.  The vomit is nonbloody.  Patient states that he has had similar pain in the past.  He states it is somewhat infrequent will occur every month or 2.  He has not noticed any pattern to it.  He does state that he smokes marijuana yesterday and smokes very occasionally.  Denies any significant recent alcohol ingestion.  Denies any hot spicy foods recently.  No fevers.   Records reviewed. Per medical record review patient has a history of asthma  Past Medical History:  Diagnosis Date   Asthma    Gallstones     There are no problems to display for this patient.   No past surgical history on file.  Prior to Admission medications   Medication Sig Start Date End Date Taking? Authorizing Provider  dicyclomine (BENTYL) 10 MG capsule Take 1 capsule (10 mg total) by mouth 3 (three) times daily as needed for up to 14 days for spasms. 04/09/17 04/23/17  Merlyn Lot, MD  dicyclomine (BENTYL) 20 MG tablet Take 1 tablet (20 mg total) by mouth 3 (three) times daily as needed (abdominal pain). 07/16/18   Nance Pear, MD  ondansetron (ZOFRAN ODT) 4 MG disintegrating tablet Take 1 tablet (4 mg total) by mouth every 8 (eight) hours as needed. 07/18/18   Lavonia Drafts, MD  ondansetron (ZOFRAN) 4 MG tablet Take 1 tablet (4 mg total) by mouth every 8 (eight) hours as needed. 07/16/18   Nance Pear, MD  pantoprazole (PROTONIX) 20 MG tablet Take 1 tablet (20 mg total) by mouth daily. 07/18/18 07/18/19  Lavonia Drafts, MD  promethazine (PHENERGAN) 12.5 MG tablet Take 1 tablet (12.5 mg total) by mouth every 6 (six) hours as needed for nausea or vomiting. 04/09/17   Merlyn Lot, MD  sucralfate (CARAFATE) 1 g tablet Take 1 tablet (1 g total) by mouth 4 (four) times daily for 20 days. 07/18/18 08/07/18  Lavonia Drafts, MD    Allergies Patient has no known allergies.  No family history on file.  Social History Social History   Tobacco Use   Smoking status: Current Every Day Smoker   Smokeless tobacco: Never Used  Scientific laboratory technician Use: Never assessed  Substance Use Topics   Alcohol use: Yes    Comment: occasionally   Drug use: Yes    Types: Marijuana    Review of Systems Constitutional: No fever/chills Eyes: No visual changes. ENT: No sore throat. Cardiovascular: Denies chest pain. Respiratory: Denies shortness of breath. Gastrointestinal: Positive for abdominal pain and nausea.  Genitourinary: Negative for dysuria. Musculoskeletal: Negative for back pain. Skin: Negative for rash. Neurological: Negative for headaches, focal weakness or numbness.  ____________________________________________   PHYSICAL EXAM:  VITAL SIGNS: ED Triage Vitals  Enc Vitals Group     BP 10/20/19 1652 (!) 159/99     Pulse Rate 10/20/19 1652 (!) 52     Resp 10/20/19 1652 16     Temp 10/20/19 1652 97.6 F (36.4 C)     Temp Source 10/20/19 1652  Oral     SpO2 10/20/19 1652 100 %     Weight 10/20/19 1653 165 lb (74.8 kg)     Height 10/20/19 1653 5\' 6"  (1.676 m)     Head Circumference --      Peak Flow --      Pain Score 10/20/19 1652 7    Constitutional: Alert and oriented.  Eyes: Conjunctivae are normal.  ENT      Head: Normocephalic and atraumatic.      Nose: No congestion/rhinnorhea.      Mouth/Throat: Mucous membranes are moist.      Neck: No stridor. Hematological/Lymphatic/Immunilogical: No cervical lymphadenopathy. Cardiovascular: Normal rate, regular rhythm.  No murmurs,  rubs, or gallops. Respiratory: Normal respiratory effort without tachypnea nor retractions. Breath sounds are clear and equal bilaterally. No wheezes/rales/rhonchi. Gastrointestinal: Soft and non tender. No rebound. No guarding.  Genitourinary: Deferred Musculoskeletal: Normal range of motion in all extremities. No lower extremity edema. Neurologic:  Normal speech and language. No gross focal neurologic deficits are appreciated.  Skin:  Skin is warm, dry and intact. No rash noted. Psychiatric: Mood and affect are normal. Speech and behavior are normal. Patient exhibits appropriate insight and judgment.  ____________________________________________    LABS (pertinent positives/negatives)  CMP wnl except glu 119, t bili 1.3 CBC wbc 9.0, hgb 14.7, plt 203 Lipase 22 UA hazy, glucose 50, ketones 20, rbc 0-5, wbc 0-5 ____________________________________________   EKG  None  ____________________________________________    RADIOLOGY  None  ____________________________________________   PROCEDURES  Procedures  ____________________________________________   INITIAL IMPRESSION / ASSESSMENT AND PLAN / ED COURSE  Pertinent labs & imaging results that were available during my care of the patient were reviewed by me and considered in my medical decision making (see chart for details).   She presented to the emergency department today with concerns for generalized abdominal pain.  He states he has had similar pain on and off for a while.  Patient's blood work without concerning leukocytosis or electrolyte abnormality.  Patient was given IV fluids and IV Haldol and did feel improvement.  Did discuss with patient possible IBS type pathology.  Will give patient Bentyl and GI referral information.  ___________________________________________   FINAL CLINICAL IMPRESSION(S) / ED DIAGNOSES  Final diagnoses:  Abdominal pain, unspecified abdominal location     Note: This dictation  was prepared with Dragon dictation. Any transcriptional errors that result from this process are unintentional     12/20/19, MD 10/20/19 2341

## 2019-10-20 NOTE — ED Triage Notes (Signed)
Pt states that he has been having abd pain intermittently for the past year, states was diagnosed with gallstones in the past but hasn't been seen again for it, pt points to the center of his abd and states that the pain radiates to the left, pt reports vomiting and pt also reports cold sweats, pt hsa notable forehead sweat at this time, iv placed by ems and was administered 4mg  of zofran and fentanyl

## 2019-10-20 NOTE — ED Notes (Signed)
BIB EMS with c/o RLQ pain. 4 zofran given, 75 mcg Fent,

## 2019-10-20 NOTE — ED Notes (Signed)
PT given warm blanket and pillow

## 2019-10-20 NOTE — ED Notes (Signed)
Pt given phone to talk with family/friend as pt's phone is out of charge.

## 2019-10-20 NOTE — ED Notes (Signed)
Idalia Needle, rn spoke with this rn asking if can let md know that pt would like ivf while he is waiting. Spoke with dr. Derrill Kay, no order for ivf received at this time.

## 2021-06-25 ENCOUNTER — Emergency Department: Payer: Self-pay

## 2021-06-25 ENCOUNTER — Emergency Department
Admission: EM | Admit: 2021-06-25 | Discharge: 2021-06-25 | Disposition: A | Discharge status: Home / Self Care | Payer: Self-pay | Attending: Emergency Medicine | Admitting: Emergency Medicine

## 2021-06-25 DIAGNOSIS — R112 Nausea with vomiting, unspecified: Secondary | ICD-10-CM | POA: Insufficient documentation

## 2021-06-25 DIAGNOSIS — R1012 Left upper quadrant pain: Secondary | ICD-10-CM | POA: Insufficient documentation

## 2021-06-25 DIAGNOSIS — E860 Dehydration: Secondary | ICD-10-CM | POA: Insufficient documentation

## 2021-06-25 DIAGNOSIS — R1013 Epigastric pain: Secondary | ICD-10-CM | POA: Insufficient documentation

## 2021-06-25 LAB — COMPREHENSIVE METABOLIC PANEL
ALT: 14 U/L (ref 0–44)
AST: 27 U/L (ref 15–41)
Albumin: 4.5 g/dL (ref 3.5–5.0)
Alkaline Phosphatase: 57 U/L (ref 38–126)
Anion gap: 8 (ref 5–15)
BUN: 8 mg/dL (ref 6–20)
CO2: 25 mmol/L (ref 22–32)
Calcium: 9.4 mg/dL (ref 8.9–10.3)
Chloride: 105 mmol/L (ref 98–111)
Creatinine, Ser: 0.92 mg/dL (ref 0.61–1.24)
GFR, Estimated: >60 mL/min (ref >60)
Glucose, Bld: 143 mg/dL — ABNORMAL HIGH (ref 70–99)
Potassium: 3.8 mmol/L (ref 3.5–5.1)
Sodium: 138 mmol/L (ref 135–145)
Total Bilirubin: 1.3 mg/dL — ABNORMAL HIGH (ref 0.3–1.2)
Total Protein: 8.5 g/dL — ABNORMAL HIGH (ref 6.5–8.1)

## 2021-06-25 LAB — URINALYSIS, ROUTINE W REFLEX MICROSCOPIC
Bilirubin Urine: NEGATIVE
Glucose, UA: 150 mg/dL — AB
Hgb urine dipstick: NEGATIVE
Ketones, ur: 20 mg/dL — AB
Leukocytes,Ua: NEGATIVE
Nitrite: NEGATIVE
Protein, ur: NEGATIVE mg/dL
Specific Gravity, Urine: 1.019 (ref 1.005–1.030)
pH: 7 (ref 5.0–8.0)

## 2021-06-25 LAB — CBC WITH DIFFERENTIAL/PLATELET
Abs Immature Granulocytes: 0.03 10*3/uL (ref 0.00–0.07)
Basophils Absolute: 0 10*3/uL (ref 0.0–0.1)
Basophils Relative: 0 %
Eosinophils Absolute: 0 10*3/uL (ref 0.0–0.5)
Eosinophils Relative: 0 %
HCT: 42.8 % (ref 39.0–52.0)
Hemoglobin: 15 g/dL (ref 13.0–17.0)
Immature Granulocytes: 0 %
Lymphocytes Relative: 15 %
Lymphs Abs: 1.3 10*3/uL (ref 0.7–4.0)
MCH: 32.5 pg (ref 26.0–34.0)
MCHC: 35 g/dL (ref 30.0–36.0)
MCV: 92.8 fL (ref 80.0–100.0)
Monocytes Absolute: 0.2 10*3/uL (ref 0.1–1.0)
Monocytes Relative: 2 %
Neutro Abs: 7.1 10*3/uL (ref 1.7–7.7)
Neutrophils Relative %: 83 %
Platelets: 222 10*3/uL (ref 150–400)
RBC: 4.61 MIL/uL (ref 4.22–5.81)
RDW: 12.4 % (ref 11.5–15.5)
WBC: 8.7 10*3/uL (ref 4.0–10.5)
nRBC: 0 % (ref 0.0–0.2)

## 2021-06-25 LAB — LIPASE, BLOOD: Lipase: 26 U/L (ref 11–51)

## 2021-06-25 LAB — TROPONIN I (HIGH SENSITIVITY): Troponin I (High Sensitivity): 4 ng/L (ref <18)

## 2021-06-25 MED ORDER — ONDANSETRON HCL 4 MG/2ML IJ SOLN
4.0000 mg | Freq: Once | INTRAMUSCULAR | Status: AC
  Administered 2021-06-25: 4 mg via INTRAVENOUS
  Filled 2021-06-25: qty 2

## 2021-06-25 MED ORDER — IOHEXOL 300 MG/ML  SOLN
100.0000 mL | Freq: Once | INTRAMUSCULAR | Status: AC | PRN
  Administered 2021-06-25: 100 mL via INTRAVENOUS

## 2021-06-25 MED ORDER — LACTATED RINGERS IV BOLUS
1000.0000 mL | Freq: Once | INTRAVENOUS | Status: AC
  Administered 2021-06-25: 1000 mL via INTRAVENOUS

## 2021-06-25 MED ORDER — ALUM & MAG HYDROXIDE-SIMETH 200-200-20 MG/5ML PO SUSP
30.0000 mL | Freq: Once | ORAL | Status: AC
  Administered 2021-06-25: 30 mL via ORAL
  Filled 2021-06-25: qty 30

## 2021-06-25 MED ORDER — DROPERIDOL 2.5 MG/ML IJ SOLN
2.5000 mg | Freq: Once | INTRAMUSCULAR | Status: AC
  Administered 2021-06-25: 2.5 mg via INTRAVENOUS
  Filled 2021-06-25: qty 2

## 2021-06-25 MED ORDER — FAMOTIDINE IN NACL 20-0.9 MG/50ML-% IV SOLN
20.0000 mg | Freq: Once | INTRAVENOUS | Status: AC
  Administered 2021-06-25: 20 mg via INTRAVENOUS
  Filled 2021-06-25: qty 50

## 2021-06-25 MED ORDER — SODIUM CHLORIDE 0.9 % IV BOLUS
1000.0000 mL | Freq: Once | INTRAVENOUS | Status: AC
  Administered 2021-06-25: 1000 mL via INTRAVENOUS

## 2021-06-25 MED ORDER — OMEPRAZOLE MAGNESIUM 20 MG PO TBEC: 20.0000 mg | DELAYED_RELEASE_TABLET | Freq: Every day | ORAL | 0 refills | Status: DC

## 2021-06-25 MED ORDER — PROMETHAZINE HCL 25 MG PO TABS: 25.0000 mg | ORAL_TABLET | Freq: Three times a day (TID) | ORAL | 0 refills | Status: DC | PRN

## 2021-06-25 MED ORDER — LIDOCAINE VISCOUS HCL 2 % MT SOLN
15.0000 mL | Freq: Once | OROMUCOSAL | Status: AC
  Administered 2021-06-25: 15 mL via ORAL
  Filled 2021-06-25: qty 15

## 2021-06-25 NOTE — ED Notes (Signed)
Awoke pt, arousable to voice, instructed [pt to drink water, drinking water at this time.

## 2021-06-25 NOTE — ED Notes (Signed)
EDP at BS 

## 2021-06-25 NOTE — ED Provider Notes (Signed)
[facility] Provider Note    Event Date/Time   First MD Initiated Contact with Patient 06/25/21 0902     (approximate)   History   Abdominal Pain (Luq pain  x 3 am started, nausea/vomiting , episode 1 month prior , pt has not followed up with GI)   HPI  Roberto Hill. is a 36 y.o. male here with severe epigastric and left upper quadrant abdominal pain.  The patient states he was in his usual state of health until around 3:00 this morning.  He is working third shift when he began to get left upper quadrant and epigastric abdominal discomfort.  He states he ate Dione Plover prior to the symptoms, which she thought triggered the discomfort.  He has a history of recurrent gastritis and GERD and's been seen here for this.  He has not followed up with GI.  The pain is aching, gnawing, worse with any kind of attempted eating or drinking.  He has had no fevers or chills.  No alleviating factors.  He has been unable to eat or drink since the onset of his symptoms.  Denies cardiac history.     Physical Exam   Triage Vital Signs: ED Triage Vitals  Enc Vitals Group     BP 06/25/21 0902 134/89     Pulse Rate 06/25/21 0901 60     Resp 06/25/21 0901 17     Temp 06/25/21 0901 98.1 F (36.7 C)     Temp Source 06/25/21 0901 Oral     SpO2 06/25/21 0901 96 %     Weight 06/25/21 0857 165 lb 5.5 oz (75 kg)     Height 06/25/21 0857 5\' 9"  (1.753 m)     Head Circumference --      Peak Flow --      Pain Score 06/25/21 0857 6     Pain Loc --      Pain Edu? --      Excl. in GC? --     Most recent vital signs: Vitals:   06/25/21 1230 06/25/21 1243  BP: (!) 139/91   Pulse: (!) 55 75  Resp:    Temp:    SpO2: 93% 97%     General: Awake, no distress.  CV:  Good peripheral perfusion.  Regular rate and rhythm.  No murmurs or rubs. Resp:  Normal effort.  Lungs clear to auscultation bilaterally. Abd:  No distention.  Minimal left upper quadrant abdominal tenderness.  No  rebound or guarding.  No right upper quadrant tenderness.  Negative Murphy sign. Other:  Well-perfused, moist mucous membranes.   ED Results / Procedures / Treatments   Labs (all labs ordered are listed, but only abnormal results are displayed) Labs Reviewed  COMPREHENSIVE METABOLIC PANEL - Abnormal; Notable for the following components:      Result Value   Glucose, Bld 143 (*)    Total Protein 8.5 (*)    Total Bilirubin 1.3 (*)    All other components within normal limits  URINALYSIS, ROUTINE W REFLEX MICROSCOPIC - Abnormal; Notable for the following components:   Color, Urine YELLOW (*)    APPearance CLEAR (*)    Glucose, UA 150 (*)    Ketones, ur 20 (*)    All other components within normal limits  CBC WITH DIFFERENTIAL/PLATELET  LIPASE, BLOOD  TROPONIN I (HIGH SENSITIVITY)  TROPONIN I (HIGH SENSITIVITY)     EKG Normal sinus rhythm, ventricular rate 54.  PR 180, QRS  105, QTc 408.  No acute ST elevations or depressions.  No EKG evidence of acute ischemia or infarct.   RADIOLOGY CT abdomen/pelvis: No acute findings to explain left upper quadrant abdominal pain.   I also independently reviewed and agree wit radiologist interpretations.   PROCEDURES:  Critical Care performed: No     MEDICATIONS ORDERED IN ED: Medications  droperidol (INAPSINE) 2.5 MG/ML injection 2.5 mg (2.5 mg Intravenous Given 06/25/21 0925)  sodium chloride 0.9 % bolus 1,000 mL (0 mLs Intravenous Stopped 06/25/21 1029)  famotidine (PEPCID) IVPB 20 mg premix (0 mg Intravenous Stopped 06/25/21 1012)  ondansetron (ZOFRAN) injection 4 mg (4 mg Intravenous Given 06/25/21 1045)  alum & mag hydroxide-simeth (MAALOX/MYLANTA) 200-200-20 MG/5ML suspension 30 mL (30 mLs Oral Given 06/25/21 1038)    And  lidocaine (XYLOCAINE) 2 % viscous mouth solution 15 mL (15 mLs Oral Given 06/25/21 1038)  lactated ringers bolus 1,000 mL (0 mLs Intravenous Stopped 06/25/21 1142)  iohexol (OMNIPAQUE) 300 MG/ML solution 100 mL  (100 mLs Intravenous Contrast Given 06/25/21 1113)     IMPRESSION / MDM / ASSESSMENT AND PLAN / ED COURSE  I reviewed the triage vital signs and the nursing notes.                               Ddx:  Viral GI illness, recurrent gastritis, GERD, unlikely cholecystitis, pancreatitis, ileus, mesenteric adenitis, atypical chest pain   MDM:  Very well-appearing 36 year old male with history of recurrent GI issues and GERD here with nausea and vomiting.  Suspect possible viral illness versus foodborne illness.  The patient is overall well-appearing and in no distress.  Lab work reviewed, shows no evidence of significant leukocytosis or anemia.  CMP is unremarkable.  Lipase is normal.  Troponin negative and EKG is nonischemic, do not suspect ACS or referred cardiac source.  Urinalysis shows dehydration and he has been given IV fluids here.  Patient did have some persistent vomiting and pain after initial treatment, so CT scan was obtained and reviewed by me.  Patient has no evidence of acute abnormality.  Patient feels better after additional doses here.  Per review of his records, he has had fairly similar issues in the past and has been referred to GI although he has not followed up.  Will encourage this follow-up, discharged with symptomatic control and outpatient follow-up.   MEDICATIONS GIVEN IN ED: Medications  droperidol (INAPSINE) 2.5 MG/ML injection 2.5 mg (2.5 mg Intravenous Given 06/25/21 0925)  sodium chloride 0.9 % bolus 1,000 mL (0 mLs Intravenous Stopped 06/25/21 1029)  famotidine (PEPCID) IVPB 20 mg premix (0 mg Intravenous Stopped 06/25/21 1012)  ondansetron (ZOFRAN) injection 4 mg (4 mg Intravenous Given 06/25/21 1045)  alum & mag hydroxide-simeth (MAALOX/MYLANTA) 200-200-20 MG/5ML suspension 30 mL (30 mLs Oral Given 06/25/21 1038)    And  lidocaine (XYLOCAINE) 2 % viscous mouth solution 15 mL (15 mLs Oral Given 06/25/21 1038)  lactated ringers bolus 1,000 mL (0 mLs Intravenous Stopped  06/25/21 1142)  iohexol (OMNIPAQUE) 300 MG/ML solution 100 mL (100 mLs Intravenous Contrast Given 06/25/21 1113)     Consults:  None   EMR reviewed  Prior ED visits for nausea and vomiting     FINAL CLINICAL IMPRESSION(S) / ED DIAGNOSES   Final diagnoses:  Nausea and vomiting, unspecified vomiting type  Dehydration     Rx / DC Orders   ED Discharge Orders  Ordered    omeprazole (PRILOSEC OTC) 20 MG tablet  Daily        06/25/21 1317    promethazine (PHENERGAN) 25 MG tablet  Every 8 hours PRN        06/25/21 1317             Note:  This document was prepared using Dragon voice recognition software and may include unintentional dictation errors.   Shaune Pollack, MD 06/25/21 1318

## 2021-06-25 NOTE — ED Notes (Signed)
Given PO water at [facility]

## 2021-06-25 NOTE — ED Notes (Signed)
Requesting emesis bag, spitting up.

## 2021-06-25 NOTE — ED Triage Notes (Signed)
Luq pain  x 3 am started, nausea/vomiting , episode 1 month prior , pt has not followed up with GI

## 2021-06-25 NOTE — ED Notes (Signed)
Sleeping, tolerating his previous 3 sips of water at 1213.

## 2022-01-12 ENCOUNTER — Encounter: Payer: Self-pay | Admitting: Emergency Medicine

## 2022-01-12 ENCOUNTER — Emergency Department
Admission: EM | Admit: 2022-01-12 | Discharge: 2022-01-12 | Disposition: A | Discharge status: Home / Self Care | Payer: Self-pay | Attending: Student in an Organized Health Care Education/Training Program | Admitting: Student in an Organized Health Care Education/Training Program

## 2022-01-12 ENCOUNTER — Other Ambulatory Visit: Payer: Self-pay

## 2022-01-12 DIAGNOSIS — B349 Viral infection, unspecified: Secondary | ICD-10-CM | POA: Insufficient documentation

## 2022-01-12 DIAGNOSIS — G5621 Lesion of ulnar nerve, right upper limb: Secondary | ICD-10-CM | POA: Insufficient documentation

## 2022-01-12 DIAGNOSIS — U071 COVID-19: Secondary | ICD-10-CM | POA: Insufficient documentation

## 2022-01-12 LAB — SARS CORONAVIRUS 2 BY RT PCR: SARS Coronavirus 2 by RT PCR: POSITIVE — AB

## 2022-01-12 MED ORDER — MELOXICAM 15 MG PO TABS: 15.0000 mg | ORAL_TABLET | Freq: Every day | ORAL | 0 refills | Status: DC

## 2022-01-12 NOTE — ED Triage Notes (Signed)
Pt here with right arm numbness and neck pain. Pt also states he has had a head cold for 4 days and is headed back to work so he wants to make sure that he is ok to return. Pt states his arm numbness is intermittent and he notices it more when he is playing video games.

## 2022-01-12 NOTE — ED Notes (Signed)
See triage note  Presents with some neck pain and intermittent arm numbness   States he did have some URI sxs' about 4 days ago   Unsure of fever and is currently afebrile on arrival to ED

## 2022-01-12 NOTE — ED Provider Notes (Signed)
[facility] Provider Note  Patient Contact: 6:00 PM (approximate)   History   Neck Pain and Numbness   HPI  Roberto R Severin Bou. is a 36 y.o. male who presents the emergency department for 2 complaints.  Patient is requesting a work note for work.  He states that he has developed a viral illness with some congestion, cough, low-grade fevers and body aches.  Patient states that he supposed to go to work tonight, does not feel up to going.  No shortness of breath, chest pain, GI symptoms.  Patient is also endorsing some intermittent numbness and tingling that runs from his elbow into his hand.  Patient states that he notices that if he is leaning up against the elbow, or keeps it bent in a certain position.  There is no radiation up into the neck.  No history of of carpal tunnel.  Patient has not tried medication for this complaint.     Physical Exam   Triage Vital Signs: ED Triage Vitals  Enc Vitals Group     BP 01/12/22 1548 130/84     Pulse Rate 01/12/22 1548 68     Resp 01/12/22 1548 16     Temp 01/12/22 1548 98.8 F (37.1 C)     Temp Source 01/12/22 1548 Oral     SpO2 01/12/22 1548 96 %     Weight 01/12/22 1548 175 lb (79.4 kg)     Height 01/12/22 1548 5\' 6"  (1.676 m)     Head Circumference --      Peak Flow --      Pain Score 01/12/22 1556 0     Pain Loc --      Pain Edu? --      Excl. in GC? --     Most recent vital signs: Vitals:   01/12/22 1548  BP: 130/84  Pulse: 68  Resp: 16  Temp: 98.8 F (37.1 C)  SpO2: 96%     General: Alert and in no acute distress. ENT:      Ears:       Nose: Mild congestion/rhinnorhea.      Mouth/Throat: Mucous membranes are moist. Neck: No stridor. No cervical spine tenderness to palpation.  Cardiovascular:  Good peripheral perfusion Respiratory: Normal respiratory effort without tachypnea or retractions. Lungs CTAB. Good air entry to the bases with no decreased or absent breath sounds. Musculoskeletal:  Full range of motion to all extremities.  Patient has no edema, ecchymosis about the arm.  Patient does have positive tenderness along the medial elbow over the ulnar nerve distribution.  Percussion in this area does slightly reproduce symptoms down the right forearm.  Mildly positive Tinel's but negative Phalen's.  Sensation, pulses intact to the right upper extremity. Neurologic:  No gross focal neurologic deficits are appreciated.  Skin:   No rash noted Other:   ED Results / Procedures / Treatments   Labs (all labs ordered are listed, but only abnormal results are displayed) Labs Reviewed  SARS CORONAVIRUS 2 BY RT PCR - Abnormal; Notable for the following components:      Result Value   SARS Coronavirus 2 by RT PCR POSITIVE (*)    All other components within normal limits     EKG     RADIOLOGY    No results found.  PROCEDURES:  Critical Care performed: No  Procedures   MEDICATIONS ORDERED IN ED: Medications - No data to display   IMPRESSION / MDM / ASSESSMENT AND  PLAN / ED COURSE  I reviewed the triage vital signs and the nursing notes.                              Differential diagnosis includes, but is not limited to, COVID, influenza, virus, carpal tunnel, cubital tunnel impingement, shoulder impingement, cervical radiculopathy   Patient's presentation is most consistent with acute presentation with potential threat to life or bodily function.   Patient's diagnosis is consistent with viral illness, cubital tunnel syndrome.  Patient presented to the ED with 2 complaints.  Patient is requesting work note as he has had viral URI symptoms, is scheduled to go back to work today.  Patient does have viral symptoms, MR COVID and flu test are pending I will discharge the patient with a work note.  Patient is also been having some intermittent issues radiating from the elbow down to the right hand.  Appears to affect the ulnar nerve distribution and percussion over the  cubital tunnel does reveal mildly reproducible symptoms.  At this time we will treat conservatively with anti-inflammatory.  Follow-up with orthopedics if symptoms or not improving with conservative treatment..  Patient is given ED precautions to return to the ED for any worsening or new symptoms.        FINAL CLINICAL IMPRESSION(S) / ED DIAGNOSES   Final diagnoses:  Viral illness  Cubital tunnel syndrome on right     Rx / DC Orders   ED Discharge Orders     None        Note:  This document was prepared using Dragon voice recognition software and may include unintentional dictation errors.   Racheal Patches, PA-C 01/12/22 1821    Willy Eddy, MD 01/12/22 [phone]

## 2022-01-23 ENCOUNTER — Other Ambulatory Visit: Payer: Self-pay

## 2022-01-23 ENCOUNTER — Emergency Department: Payer: Self-pay

## 2022-01-23 ENCOUNTER — Emergency Department
Admission: EM | Admit: 2022-01-23 | Discharge: 2022-01-23 | Disposition: A | Discharge status: Home / Self Care | Payer: Self-pay | Attending: Emergency Medicine | Admitting: Emergency Medicine

## 2022-01-23 DIAGNOSIS — S93401A Sprain of unspecified ligament of right ankle, initial encounter: Secondary | ICD-10-CM | POA: Insufficient documentation

## 2022-01-23 DIAGNOSIS — W208XXA Other cause of strike by thrown, projected or falling object, initial encounter: Secondary | ICD-10-CM | POA: Insufficient documentation

## 2022-01-23 DIAGNOSIS — M25571 Pain in right ankle and joints of right foot: Secondary | ICD-10-CM | POA: Insufficient documentation

## 2022-01-23 DIAGNOSIS — Z23 Encounter for immunization: Secondary | ICD-10-CM | POA: Insufficient documentation

## 2022-01-23 MED ORDER — TETANUS-DIPHTH-ACELL PERTUSSIS 5-2.5-18.5 LF-MCG/0.5 IM SUSY
0.5000 mL | PREFILLED_SYRINGE | Freq: Once | INTRAMUSCULAR | Status: AC
  Administered 2022-01-23: 0.5 mL via INTRAMUSCULAR
  Filled 2022-01-23: qty 0.5

## 2022-01-23 MED ORDER — KETOROLAC TROMETHAMINE 30 MG/ML IJ SOLN
30.0000 mg | Freq: Once | INTRAMUSCULAR | Status: AC
  Administered 2022-01-23: 30 mg via INTRAMUSCULAR
  Filled 2022-01-23: qty 1

## 2022-01-23 NOTE — ED Notes (Addendum)
See triage note. Swelling noted to pt's R ankle and foot; both feet warm and appropriate color; pt able to wiggle toes at R foot; pt can plantar flex some but unable to dorsiflex any currently due to pain; pt able to shift foot side to side minimally due to pain; L dorsalis pedis pulse 2+; unable to palpate R foot pulse due to swelling; able to auscultate R dorsalis pedis pulse clearly with doppler. Pt reports used ice initially which dec swelling but didn't want to use ice again due to pain.

## 2022-01-23 NOTE — ED Triage Notes (Signed)
Reports heavy rims fell on foot and ankle this morning while pt was in garage. Reports pain and swelling has worsened throughout day and is now unable to bear weight. Pt able to move toes and denies diminished sensation .

## 2022-01-23 NOTE — ED Provider Notes (Signed)
[facility] Provider Note  Patient Contact: 10:15 PM (approximate)   History   Ankle Pain   HPI  Roberto R Brason Berthelot. is a 36 y.o. male presents to the emergency department with right foot and ankle pain after heavy when he fell on patient.  Patient has had some soft tissue swelling since injury occurred.  No similar injuries in the past.  Patient does have small abrasion.  He cannot remember his last tetanus shot.      Physical Exam   Triage Vital Signs: ED Triage Vitals  Enc Vitals Group     BP 01/23/22 2033 124/75     Pulse Rate 01/23/22 2033 72     Resp 01/23/22 2033 20     Temp 01/23/22 2033 98.8 F (37.1 C)     Temp Source 01/23/22 2033 Oral     SpO2 01/23/22 2033 98 %     Weight 01/23/22 2033 185 lb (83.9 kg)     Height 01/23/22 2033 5\' 6"  (1.676 m)     Head Circumference --      Peak Flow --      Pain Score 01/23/22 2055 8     Pain Loc --      Pain Edu? --      Excl. in GC? --     Most recent vital signs: Vitals:   01/23/22 2033  BP: 124/75  Pulse: 72  Resp: 20  Temp: 98.8 F (37.1 C)  SpO2: 98%     General: Alert and in no acute distress. Eyes:  PERRL. EOMI. Head: No acute traumatic findings ENT:      Nose: No congestion/rhinnorhea.      Mouth/Throat: Mucous membranes are moist. Neck: No stridor. No cervical spine tenderness to palpation. Cardiovascular:  Good peripheral perfusion Respiratory: Normal respiratory effort without tachypnea or retractions. Lungs CTAB. Good air entry to the bases with no decreased or absent breath sounds. Gastrointestinal: Bowel sounds 4 quadrants. Soft and nontender to palpation. No guarding or rigidity. No palpable masses. No distention. No CVA tenderness. Musculoskeletal: Full range of motion to all extremities.  Patient has soft tissue swelling along the dorsal aspect of the right foot and ankle.  Palpable radial and ulnar pulses bilaterally and symmetrically.  Capillary refill less than 2  seconds on the right. Neurologic:  No gross focal neurologic deficits are appreciated.  Skin:   No rash noted Other:   ED Results / Procedures / Treatments   Labs (all labs ordered are listed, but only abnormal results are displayed) Labs Reviewed - No data to display      RADIOLOGY  I personally viewed and evaluated these images as part of my medical decision making, as well as reviewing the written report by the radiologist.  ED Provider Interpretation: No acute bony abnormality on x-rays of the right foot and ankle.   PROCEDURES:  Critical Care performed: No  Procedures   MEDICATIONS ORDERED IN ED: Medications  ketorolac (TORADOL) 30 MG/ML injection 30 mg (has no administration in time range)  Tdap (BOOSTRIX) injection 0.5 mL (has no administration in time range)     IMPRESSION / MDM / ASSESSMENT AND PLAN / ED COURSE  I reviewed the triage vital signs and the nursing notes.                              Assessment and plan:  Right foot pain Right ankle pain:  36 year old male presents to the emergency department with acute right foot and ankle pain.  No acute bony abnormality on x-ray.  Recommended Tylenol and ibuprofen alternating for discomfort.  Patient's tetanus status was updated in the emergency department.  Return precautions were given to return with new or worsening symptoms.   FINAL CLINICAL IMPRESSION(S) / ED DIAGNOSES   Final diagnoses:  Sprain of right ankle, unspecified ligament, initial encounter     Rx / DC Orders   ED Discharge Orders     None        Note:  This document was prepared using Dragon voice recognition software and may include unintentional dictation errors.   Pia Mau [place], PA-C 01/23/22 2219    Sharyn Creamer, MD 01/23/22 2329

## 2022-03-15 ENCOUNTER — Other Ambulatory Visit: Payer: Self-pay

## 2022-03-15 ENCOUNTER — Emergency Department
Admission: EM | Admit: 2022-03-15 | Discharge: 2022-03-16 | Disposition: A | Discharge status: Home / Self Care | Payer: Self-pay | Attending: Emergency Medicine | Admitting: Emergency Medicine

## 2022-03-15 DIAGNOSIS — J45909 Unspecified asthma, uncomplicated: Secondary | ICD-10-CM | POA: Insufficient documentation

## 2022-03-15 DIAGNOSIS — G5622 Lesion of ulnar nerve, left upper limb: Secondary | ICD-10-CM | POA: Insufficient documentation

## 2022-03-15 NOTE — ED Notes (Signed)
Pt to desk - states that he fell asleep in waiting room and was missed. Told pt that I couldn't find him to room. Pt placed back into waiting for room.

## 2022-03-15 NOTE — ED Triage Notes (Signed)
Pt arrives via POV with CC of numbness in L fourth and fifth fingers for the last two days. Pt states he thought he had slept on his hand wrong but the numbness has not gone away. Pt states he is here for work note.

## 2022-03-15 NOTE — ED Notes (Signed)
First RN: Pt reports numbness in left hand ring and pinky fingers. 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 Pt in no distress, no other numbness.

## 2022-03-16 NOTE — ED Provider Notes (Signed)
[facility] Provider Note   Event Date/Time   First MD Initiated Contact with Patient 03/16/22 [phone]     (approximate) History  Numbness  HPI Roberto R Arshia Rondon. is a 36 y.o. male with stated past medical history of asthma who presents for the second time in 2 days with complaints of left fourth and fifth finger numbness that occurred after sleeping the wrong way.  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 ROS: Patient currently denies any vision changes, tinnitus, difficulty speaking, facial droop, sore throat, chest pain, shortness of breath, abdominal pain, nausea/vomiting/diarrhea, dysuria, or weakness/paresthesias in any extremity   Physical Exam  Triage Vital Signs: ED Triage Vitals  Enc Vitals Group     BP 03/15/22 1930 (!) 147/80     Pulse Rate 03/15/22 1928 64     Resp 03/15/22 1928 19     Temp 03/15/22 1928 98.5 F (36.9 C)     Temp Source 03/15/22 1928 Oral     SpO2 03/15/22 1928 98 %     Weight 03/15/22 1928 185 lb (83.9 kg)     Height 03/15/22 1928 5\' 6"  (1.676 m)     Head Circumference --      Peak Flow --      Pain Score 03/15/22 1928 0     Pain Loc --      Pain Edu? --      Excl. in [place]? --    Most recent vital signs: Vitals:   03/15/22 1928 03/15/22 1930  BP:  (!) 147/80  Pulse: 64   Resp: 19   Temp: 98.5 F (36.9 C)   SpO2: 98%    General: Awake, oriented x4. CV:  Good peripheral perfusion.  Resp:  Normal effort.  Abd:  No distention.  Other:  Middle-aged African-American male sitting in bed in no acute distress.  Decreased sensation of the pinky and ring finger of the left hand ED Results / Procedures / Treatments   PROCEDURES: Critical Care performed: No Procedures MEDICATIONS ORDERED IN  ED: Medications - No data to display IMPRESSION / MDM / [place] / ED COURSE  I reviewed the triage vital signs and the nursing notes.                              Patient's presentation is most consistent with acute presentation with potential threat to life or bodily function. Presents with sensation of numbness to left fourth and fifth digit.  Patients symptoms and work-up not consistent with a stroke and therefore they will be discharged from the ED.  Patient has currently been stabilized in the emergency department.. -Patient provided with a work note Patients symptoms not typical for other emergent causes such as dissection, infection, DKA, trauma. Patient will be discharged with strict return precautions and follow up with primary MD within 24 hours for further evaluation.   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   Merwyn Katos, MD 03/16/22 [phone]

## 2022-09-27 ENCOUNTER — Emergency Department
Admission: EM | Admit: 2022-09-27 | Discharge: 2022-09-27 | Disposition: A | Discharge status: Home / Self Care | Payer: Self-pay | Attending: Emergency Medicine | Admitting: Emergency Medicine

## 2022-09-27 ENCOUNTER — Other Ambulatory Visit: Payer: Self-pay

## 2022-09-27 ENCOUNTER — Encounter: Payer: Self-pay | Admitting: Emergency Medicine

## 2022-09-27 DIAGNOSIS — S39012A Strain of muscle, fascia and tendon of lower back, initial encounter: Secondary | ICD-10-CM | POA: Insufficient documentation

## 2022-09-27 DIAGNOSIS — X58XXXA Exposure to other specified factors, initial encounter: Secondary | ICD-10-CM | POA: Insufficient documentation

## 2022-09-27 MED ORDER — BACLOFEN 10 MG PO TABS
10.0000 mg | ORAL_TABLET | Freq: Once | ORAL | Status: AC
  Administered 2022-09-27: 10 mg via ORAL
  Filled 2022-09-27: qty 1

## 2022-09-27 MED ORDER — BACLOFEN 10 MG PO TABS: 10.0000 mg | ORAL_TABLET | Freq: Three times a day (TID) | ORAL | 0 refills | Status: AC

## 2022-09-27 MED ORDER — IBUPROFEN 600 MG PO TABS
600.0000 mg | ORAL_TABLET | Freq: Once | ORAL | Status: AC
  Administered 2022-09-27: 600 mg via ORAL
  Filled 2022-09-27: qty 1

## 2022-09-27 MED ORDER — MELOXICAM 15 MG PO TABS: 15.0000 mg | ORAL_TABLET | Freq: Every day | ORAL | 0 refills | Status: AC

## 2022-09-27 NOTE — ED Triage Notes (Signed)
Pt to ED via POV for lower back pain. Pt states he has had symptoms x 2 days. Pt states reports that a few weeks ago he had back pain but it resolved. Pt denies injury or lifting anything. Pt is in NAD.

## 2022-09-27 NOTE — Discharge Instructions (Signed)
Follow up with your regular doctor if not improving in 3 days 

## 2022-09-27 NOTE — ED Provider Notes (Signed)
[facility] Provider Note    Event Date/Time   First MD Initiated Contact with Patient 09/27/22 1200     (approximate)   History   Back Pain   HPI  Roberto R Brent Ptak. is a 37 y.o. male with history of asthma presents emergency department with low back pain for 2 days.  States he has been sitting in a gaming chair for quite a while.  Also works in a [employer].  No known injury.  States just felt sore covid get out of bed easily.  No numbness or tingling.      Physical Exam   Triage Vital Signs: ED Triage Vitals  Enc Vitals Group     BP 09/27/22 1144 (!) 144/91     Pulse Rate 09/27/22 1144 61     Resp 09/27/22 1144 18     Temp 09/27/22 1144 98.5 F (36.9 C)     Temp Source 09/27/22 1144 Oral     SpO2 09/27/22 1144 97 %     Weight 09/27/22 1144 191 lb (86.6 kg)     Height 09/27/22 1144 5\' 6"  (1.676 m)     Head Circumference --      Peak Flow --      Pain Score 09/27/22 1147 3     Pain Loc --      Pain Edu? --      Excl. in [place]? --     Most recent vital signs: Vitals:   09/27/22 1144  BP: (!) 144/91  Pulse: 61  Resp: 18  Temp: 98.5 F (36.9 C)  SpO2: 97%     General: Awake, no distress.   CV:  Good peripheral perfusion. regular rate and  rhythm Resp:  Normal effort.  Abd:  No distention.   Other:  Lumbar spine is nontender, SI joints nontender, spinal spasm noted of the long the left paravertebral muscles, 5 or 5 strength lower extremities   ED Results / Procedures / Treatments   Labs (all labs ordered are listed, but only abnormal results are displayed) Labs Reviewed - No data to display   EKG     RADIOLOGY     PROCEDURES:   Procedures   MEDICATIONS ORDERED IN ED: Medications  ibuprofen (ADVIL) tablet 600 mg (has no administration in time range)  baclofen (LIORESAL) tablet 10 mg (has no administration in time range)     IMPRESSION / MDM / ASSESSMENT AND PLAN / ED COURSE  I reviewed the triage  vital signs and the nursing notes.                              Differential diagnosis includes, but is not limited to, muscle spasm, muscle strain, lumbar radiculopathy  Patient's presentation is most consistent with acute, uncomplicated illness.   Patient has no bony tenderness and no radicular pain so will not do any imaging.  This is basically due to a muscle spasm.  Patient is able to move easily and walk without difficulty.  He was given a dose of ibuprofen here in the ED, baclofen here in the ED.  Prescription for meloxicam and baclofen at discharge.  He is to follow-up with his regular doctor if not improving in 2 to 3 days.  Return if worsening.  Was discharged in stable condition.      FINAL CLINICAL IMPRESSION(S) / ED DIAGNOSES   Final diagnoses:  Strain of lumbar region,  initial encounter     Rx / DC Orders   ED Discharge Orders          Ordered    meloxicam (MOBIC) 15 MG tablet  Daily        09/27/22 1219    baclofen (LIORESAL) 10 MG tablet  3 times daily        09/27/22 1219             Note:  This document was prepared using Dragon voice recognition software and may include unintentional dictation errors.    Faythe Ghee, PA-C 09/27/22 1339    Phineas Semen, MD 09/27/22 [phone]

## 2023-03-01 ENCOUNTER — Emergency Department
Admission: EM | Admit: 2023-03-01 | Discharge: 2023-03-01 | Disposition: A | Discharge status: Home / Self Care | Payer: Self-pay | Attending: Emergency Medicine | Admitting: Emergency Medicine

## 2023-03-01 ENCOUNTER — Emergency Department: Payer: Self-pay

## 2023-03-01 ENCOUNTER — Other Ambulatory Visit: Payer: Self-pay

## 2023-03-01 DIAGNOSIS — R109 Unspecified abdominal pain: Secondary | ICD-10-CM | POA: Insufficient documentation

## 2023-03-01 DIAGNOSIS — R112 Nausea with vomiting, unspecified: Secondary | ICD-10-CM | POA: Insufficient documentation

## 2023-03-01 LAB — CBC
HCT: 46.9 % (ref 39.0–52.0)
Hemoglobin: 16.3 g/dL (ref 13.0–17.0)
MCH: 32.5 pg (ref 26.0–34.0)
MCHC: 34.8 g/dL (ref 30.0–36.0)
MCV: 93.4 fL (ref 80.0–100.0)
Platelets: 200 10*3/uL (ref 150–400)
RBC: 5.02 MIL/uL (ref 4.22–5.81)
RDW: 12.4 % (ref 11.5–15.5)
WBC: 6.4 10*3/uL (ref 4.0–10.5)
nRBC: 0 % (ref 0.0–0.2)

## 2023-03-01 LAB — COMPREHENSIVE METABOLIC PANEL
ALT: 15 U/L (ref 0–44)
AST: 28 U/L (ref 15–41)
Albumin: 3.9 g/dL (ref 3.5–5.0)
Alkaline Phosphatase: 59 U/L (ref 38–126)
Anion gap: 9 (ref 5–15)
BUN: 8 mg/dL (ref 6–20)
CO2: 22 mmol/L (ref 22–32)
Calcium: 8.7 mg/dL — ABNORMAL LOW (ref 8.9–10.3)
Chloride: 104 mmol/L (ref 98–111)
Creatinine, Ser: 0.96 mg/dL (ref 0.61–1.24)
GFR, Estimated: >60 mL/min (ref >60)
Glucose, Bld: 124 mg/dL — ABNORMAL HIGH (ref 70–99)
Potassium: 3.8 mmol/L (ref 3.5–5.1)
Sodium: 135 mmol/L (ref 135–145)
Total Bilirubin: 1.8 mg/dL — ABNORMAL HIGH (ref 0.3–1.2)
Total Protein: 7.4 g/dL (ref 6.5–8.1)

## 2023-03-01 LAB — LIPASE, BLOOD: Lipase: 25 U/L (ref 11–51)

## 2023-03-01 MED ORDER — IOHEXOL 300 MG/ML  SOLN
100.0000 mL | Freq: Once | INTRAMUSCULAR | Status: AC | PRN
  Administered 2023-03-01: 100 mL via INTRAVENOUS

## 2023-03-01 MED ORDER — HALOPERIDOL LACTATE 5 MG/ML IJ SOLN
5.0000 mg | Freq: Once | INTRAMUSCULAR | Status: AC
  Administered 2023-03-01: 5 mg via INTRAVENOUS
  Filled 2023-03-01: qty 1

## 2023-03-01 MED ORDER — DICYCLOMINE HCL 10 MG PO CAPS: 10.0000 mg | ORAL_CAPSULE | Freq: Three times a day (TID) | ORAL | 0 refills | Status: AC | PRN

## 2023-03-01 MED ORDER — ONDANSETRON HCL 4 MG PO TABS: 4.0000 mg | ORAL_TABLET | Freq: Three times a day (TID) | ORAL | 0 refills | Status: AC | PRN

## 2023-03-01 NOTE — ED Notes (Signed)
See triage note  Presents with some abd discomfort and n/v  States this woke him up early this am

## 2023-03-01 NOTE — ED Triage Notes (Signed)
Pt comes via EMs from home with c/o abdominal pain. Pt states left sided flank pain that radiates to back. Pt states this started at 5am and has been vomiting. Pt states 3rd time with pain like this. Pt has hx of gallstone.   VSS

## 2023-03-01 NOTE — ED Notes (Signed)
Pt verbalizes understanding of discharge instructions. Opportunity for questioning and answers were provided. Pt discharged from ED to home with family.    

## 2023-03-01 NOTE — ED Provider Notes (Signed)
[facility] Provider Note    Event Date/Time   First MD Initiated Contact with Patient 03/01/23 [phone]     (approximate)   History   Abdominal Pain   HPI  Roberto R Allec Claydon. is a 37 y.o. male who presents to the emergency department today because of concerns for abdominal pain.  This has been accompanied by nausea and vomiting.  Pain is located primarily on the left side.  Pain started this morning.  He has had similar pain in the past.  No fevers.     Physical Exam   Triage Vital Signs: ED Triage Vitals  Encounter Vitals Group     BP 03/01/23 0826 118/84     Systolic BP Percentile --      Diastolic BP Percentile --      Pulse Rate 03/01/23 0826 (!) 58     Resp 03/01/23 0826 19     Temp 03/01/23 0826 98.6 F (37 C)     Temp Source 03/01/23 0826 Oral     SpO2 03/01/23 0826 100 %     Weight 03/01/23 0831 180 lb (81.6 kg)     Height 03/01/23 0831 5\' 6"  (1.676 m)     Head Circumference --      Peak Flow --      Pain Score 03/01/23 0825 8     Pain Loc --      Pain Education --      Exclude from Growth Chart --     Most recent vital signs: Vitals:   03/01/23 0826  BP: 118/84  Pulse: (!) 58  Resp: 19  Temp: 98.6 F (37 C)  SpO2: 100%   General: Awake, alert, oriented. CV:  Good peripheral perfusion.  Resp:  Normal effort.  Abd:  No distention.    ED Results / Procedures / Treatments   Labs (all labs ordered are listed, but only abnormal results are displayed) Labs Reviewed  COMPREHENSIVE METABOLIC PANEL - Abnormal; Notable for the following components:      Result Value   Glucose, Bld 124 (*)    Calcium 8.7 (*)    Total Bilirubin 1.8 (*)    All other components within normal limits  LIPASE, BLOOD  CBC  URINALYSIS, ROUTINE W REFLEX MICROSCOPIC     EKG  None   RADIOLOGY I independently interpreted and visualized the CT abd/pel. My interpretation: No free air. Radiology interpretation: IMPRESSION:  No obstruction, free  air or free fluid. Normal appendix. Increasing  geographic areas of focal fat deposition in the liver.   Slightly patulous distal esophagus.      PROCEDURES:  Critical Care performed: No  MEDICATIONS ORDERED IN ED: Medications - No data to display   IMPRESSION / MDM / ASSESSMENT AND PLAN / ED COURSE  I reviewed the triage vital signs and the nursing notes.                              Differential diagnosis includes, but is not limited to, gallbladder disease, pancreatitis, hepatitis, gastroenteritis.  Patient's presentation is most consistent with acute presentation with potential threat to life or bodily function.   Patient presented to the emerged to department today because of concerns for abdominal pain, nausea and vomiting.  Patient was afebrile here.  Blood work without any leukocytosis.  However given significance of pain will obtain CT scan to evaluate for any intra-abdominal infection or inflammation.  CT scan did not show any concerning abnormalities.  Patient did feel some improvement after medication.  Will plan on discharging with symptomatic treatment.     FINAL CLINICAL IMPRESSION(S) / ED DIAGNOSES   Final diagnoses:  Abdominal pain, unspecified abdominal location  Nausea and vomiting, unspecified vomiting type     Note:  This document was prepared using Dragon voice recognition software and may include unintentional dictation errors.    Phineas Semen, MD 03/01/23 [phone]

## 2023-03-04 ENCOUNTER — Emergency Department
Admission: EM | Admit: 2023-03-04 | Discharge: 2023-03-04 | Disposition: A | Discharge status: Home / Self Care | Payer: Self-pay | Attending: Emergency Medicine | Admitting: Emergency Medicine

## 2023-03-04 ENCOUNTER — Other Ambulatory Visit: Payer: Self-pay

## 2023-03-04 DIAGNOSIS — R112 Nausea with vomiting, unspecified: Secondary | ICD-10-CM | POA: Insufficient documentation

## 2023-03-04 DIAGNOSIS — R1115 Cyclical vomiting syndrome unrelated to migraine: Secondary | ICD-10-CM

## 2023-03-04 DIAGNOSIS — R1013 Epigastric pain: Secondary | ICD-10-CM | POA: Insufficient documentation

## 2023-03-04 DIAGNOSIS — E876 Hypokalemia: Secondary | ICD-10-CM | POA: Insufficient documentation

## 2023-03-04 LAB — COMPREHENSIVE METABOLIC PANEL
ALT: 20 U/L (ref 0–44)
AST: 41 U/L (ref 15–41)
Albumin: 4.2 g/dL (ref 3.5–5.0)
Alkaline Phosphatase: 58 U/L (ref 38–126)
Anion gap: 10 (ref 5–15)
BUN: 10 mg/dL (ref 6–20)
CO2: 28 mmol/L (ref 22–32)
Calcium: 8.7 mg/dL — ABNORMAL LOW (ref 8.9–10.3)
Chloride: 101 mmol/L (ref 98–111)
Creatinine, Ser: 1.08 mg/dL (ref 0.61–1.24)
GFR, Estimated: >60 mL/min (ref >60)
Glucose, Bld: 118 mg/dL — ABNORMAL HIGH (ref 70–99)
Potassium: 3 mmol/L — ABNORMAL LOW (ref 3.5–5.1)
Sodium: 139 mmol/L (ref 135–145)
Total Bilirubin: 0.9 mg/dL (ref 0.3–1.2)
Total Protein: 7.1 g/dL (ref 6.5–8.1)

## 2023-03-04 LAB — CBC
HCT: 42.2 % (ref 39.0–52.0)
Hemoglobin: 15.4 g/dL (ref 13.0–17.0)
MCH: 32.7 pg (ref 26.0–34.0)
MCHC: 36.5 g/dL — ABNORMAL HIGH (ref 30.0–36.0)
MCV: 89.6 fL (ref 80.0–100.0)
Platelets: 241 10*3/uL (ref 150–400)
RBC: 4.71 MIL/uL (ref 4.22–5.81)
RDW: 11.9 % (ref 11.5–15.5)
WBC: 9.1 10*3/uL (ref 4.0–10.5)
nRBC: 0 % (ref 0.0–0.2)

## 2023-03-04 LAB — LIPASE, BLOOD: Lipase: 28 U/L (ref 11–51)

## 2023-03-04 MED ORDER — CAPSAICIN 0.1 % EX CREA: TOPICAL_CREAM | CUTANEOUS | 0 refills | Status: AC

## 2023-03-04 MED ORDER — ONDANSETRON 4 MG PO TBDP: 4.0000 mg | ORAL_TABLET | Freq: Three times a day (TID) | ORAL | 0 refills | Status: AC | PRN

## 2023-03-04 MED ORDER — PANTOPRAZOLE SODIUM 20 MG PO TBEC: 20.0000 mg | DELAYED_RELEASE_TABLET | Freq: Every day | ORAL | 1 refills | Status: DC

## 2023-03-04 MED ORDER — DROPERIDOL 2.5 MG/ML IJ SOLN
2.5000 mg | Freq: Once | INTRAMUSCULAR | Status: AC
  Administered 2023-03-04: 2.5 mg via INTRAVENOUS
  Filled 2023-03-04: qty 2

## 2023-03-04 MED ORDER — PANTOPRAZOLE SODIUM 20 MG PO TBEC: 20.0000 mg | DELAYED_RELEASE_TABLET | Freq: Every day | ORAL | 2 refills | Status: AC

## 2023-03-04 MED ORDER — POTASSIUM CHLORIDE CRYS ER 20 MEQ PO TBCR
20.0000 meq | EXTENDED_RELEASE_TABLET | Freq: Every day | ORAL | 0 refills | Status: AC
Start: 2023-03-04 — End: 2023-03-09

## 2023-03-04 MED ORDER — FAMOTIDINE IN NACL 20-0.9 MG/50ML-% IV SOLN
20.0000 mg | Freq: Once | INTRAVENOUS | Status: AC
  Administered 2023-03-04: 20 mg via INTRAVENOUS
  Filled 2023-03-04: qty 50

## 2023-03-04 NOTE — ED Notes (Signed)
See triage note  Present with some lower abd pain   Pain is worse on the left  Afebrile on arrival Was given Zofran by EMS   states that helped the nausea

## 2023-03-04 NOTE — ED Triage Notes (Signed)
Pt presents to ED with c/o of ABD pain, N/V. When pt asked if he smokes marijuana he endorses "yes" daily. Pt states times he has recently been seen 3 times for this at [facility]. NAD noted.

## 2023-03-04 NOTE — Discharge Instructions (Addendum)
You were seen in the emergency department for left-sided abdominal pain with nausea and vomiting.  Your lab work was overall normal.  Concerned that you are having severe gastritis or hyperemesis syndrome.  You are given information to follow-up with a primary care physician.  You may need to scope if your symptoms continue after you stop smoking marijuana.  Do not use marijuana as this likely is causing your recurrent nausea and vomiting symptoms.  You are given a prescription for Zofran, an acid reducing medication, potassium and capsaicin cream.  Take these medications as prescribed.  Follow-up closely with your primary care physician, you were given a referral and a list of primary care physician in the area accepting new patients.  Stay hydrated and drink plenty of fluids and return to the emergency department for any worsening symptoms.  Your potassium was mildly low when checked today.  Make sure to follow up with a primary doctor to follow up your labs.  Make sure to eat food high in potassium and magnesium - examples - potatoes, spinach, bananas, beans, avocadoes, oranges, nuts.  Thank you for choosing [place] for your health care, it was my pleasure to care for you today!  Corena Herter, MD

## 2023-03-04 NOTE — ED Provider Notes (Signed)
[facility] Provider Note    Event Date/Time   First MD Initiated Contact with Patient 03/04/23 [phone]     (approximate)   History   Abdominal Pain   HPI  Roberto Hill. is a 37 y.o. male presents to the emergency department with nausea, vomiting and left-sided abdominal pain.  Seen in the emergency department 3 days ago with similar symptoms.  Had a CT scan at that time that did not show any findings.  States that he has had ongoing upper abdominal pain with nausea and vomiting.  Some diarrhea.  Denies any blood in his stool.  Mild improvement of his nausea with IV Zofran.  Does endorse daily marijuana use and smokes marijuana approximately twice a day.  No prior endoscopy.  Denies any significant alcohol use.  Denies any chest pain or shortness of breath.  No history of kidney stones.  No dysuria, urinary urgency or frequency.     Physical Exam   Triage Vital Signs: ED Triage Vitals  Encounter Vitals Group     BP 03/04/23 0737 (!) 118/100     Systolic BP Percentile --      Diastolic BP Percentile --      Pulse Rate 03/04/23 0737 74     Resp 03/04/23 0737 17     Temp 03/04/23 0737 98.1 F (36.7 C)     Temp Source 03/04/23 0737 Oral     SpO2 03/04/23 0737 100 %     Weight 03/04/23 0738 175 lb (79.4 kg)     Height 03/04/23 0738 5\' 6"  (1.676 m)     Head Circumference --      Peak Flow --      Pain Score 03/04/23 0738 10     Pain Loc --      Pain Education --      Exclude from Growth Chart --     Most recent vital signs: Vitals:   03/04/23 0737  BP: (!) 118/100  Pulse: 74  Resp: 17  Temp: 98.1 F (36.7 C)  SpO2: 100%    Physical Exam Constitutional:      General: He is in acute distress.     Appearance: He is well-developed.  HENT:     Head: Atraumatic.  Eyes:     Conjunctiva/sclera: Conjunctivae normal.  Cardiovascular:     Rate and Rhythm: Regular rhythm.  Pulmonary:     Effort: No respiratory distress.  Abdominal:      Tenderness: There is abdominal tenderness in the epigastric area and left upper quadrant. There is no guarding. Negative signs include Murphy's sign and McBurney's sign.  Musculoskeletal:     Cervical back: Normal range of motion.  Skin:    General: Skin is warm.     Capillary Refill: Capillary refill takes less than 2 seconds.  Neurological:     Mental Status: He is alert. Mental status is at baseline.     IMPRESSION / MDM / ASSESSMENT AND PLAN / ED COURSE  I reviewed the triage vital signs and the nursing notes.  On chart review patient was seen in the emergency department 3 days ago with similar symptoms.  Had a CT scan that was reassuring.  Differential diagnosis including cyclical vomiting syndrome secondary to marijuana, gastritis, PUD, pancreatitis.  Have a low suspicion for symptomatic cholelithiasis, no right upper quadrant abdominal pain.  Low suspicion for AAA.  Clinical picture is not consistent with kidney stones or dissection.  No tachycardic or  bradycardic dysrhythmias while on cardiac telemetry.  LABS (all labs ordered are listed, but only abnormal results are displayed) Labs interpreted as -    Labs Reviewed  COMPREHENSIVE METABOLIC PANEL - Abnormal; Notable for the following components:      Result Value   Potassium 3.0 (*)    Glucose, Bld 118 (*)    Calcium 8.7 (*)    All other components within normal limits  CBC - Abnormal; Notable for the following components:   MCHC 36.5 (*)    All other components within normal limits  LIPASE, BLOOD  URINALYSIS, ROUTINE W REFLEX MICROSCOPIC     MDM    Patient was given IV droperidol.  Given IV Pepcid.  Lab work overall reassuring, mild hypokalemia.  No symptoms of urinary tract infection or pyelonephritis.  Have a low suspicion for kidney stones.  On reevaluation patient states he is feeling much better.  Repeat abdominal exam continues to be benign and nontender.  Have a low suspicion for acute diverticulitis,  abscess or other acute intra-abdominal pathology.  Most likely with a cyclical vomiting syndrome, gastritis or peptic ulcer disease.  Discussed at length sustaining for marijuana.  Discussed close follow-up with primary care physician, given a referral and a list of primary care physicians in the area.  Will start the patient on capsaicin cream, Zofran, Protonix and given 5 days of potassium.  Given return precautions for any worsening symptoms.  PROCEDURES:  Critical Care performed: No  Procedures  Patient's presentation is most consistent with acute presentation with potential threat to life or bodily function.   MEDICATIONS ORDERED IN ED: Medications  droperidol (INAPSINE) 2.5 MG/ML injection 2.5 mg (2.5 mg Intravenous Given 03/04/23 0825)  famotidine (PEPCID) IVPB 20 mg premix (20 mg Intravenous New Bag/Given 03/04/23 0825)    FINAL CLINICAL IMPRESSION(S) / ED DIAGNOSES   Final diagnoses:  Epigastric pain  Nausea and vomiting, unspecified vomiting type  Cyclical vomiting  Hypokalemia     Rx / DC Orders   ED Discharge Orders          Ordered    Ambulatory Referral to Primary Care (Establish Care)        03/04/23 0848    pantoprazole (PROTONIX) 20 MG tablet  Daily,   Status:  Discontinued        03/04/23 0848    ondansetron (ZOFRAN-ODT) 4 MG disintegrating tablet  Every 8 hours PRN        03/04/23 0848    pantoprazole (PROTONIX) 20 MG tablet  Daily        03/04/23 0848    potassium chloride SA (KLOR-CON M) 20 MEQ tablet  Daily        03/04/23 0930    Capsaicin 0.1 % CREA        03/04/23 0934             Note:  This document was prepared using Dragon voice recognition software and may include unintentional dictation errors.   Corena Herter, MD 03/04/23 [phone]

## 2023-03-04 NOTE — ED Triage Notes (Signed)
First Nurse Note:  Pt via [facility] from home. Pt c/o LLQ abd pain since this AM, pt has been seen previously for same. Reports vomiting. EMS gave 4mg  Zofran en route. 18G to L AC. Pt is A&Ox4 and NAD

## 2023-05-17 ENCOUNTER — Emergency Department
Admission: EM | Admit: 2023-05-17 | Discharge: 2023-05-17 | Disposition: A | Discharge status: Home / Self Care | Payer: Self-pay | Attending: Emergency Medicine | Admitting: Emergency Medicine

## 2023-05-17 ENCOUNTER — Other Ambulatory Visit: Payer: Self-pay

## 2023-05-17 DIAGNOSIS — R059 Cough, unspecified: Secondary | ICD-10-CM | POA: Insufficient documentation

## 2023-05-17 DIAGNOSIS — B974 Respiratory syncytial virus as the cause of diseases classified elsewhere: Secondary | ICD-10-CM | POA: Insufficient documentation

## 2023-05-17 DIAGNOSIS — B338 Other specified viral diseases: Secondary | ICD-10-CM

## 2023-05-17 DIAGNOSIS — J45909 Unspecified asthma, uncomplicated: Secondary | ICD-10-CM | POA: Insufficient documentation

## 2023-05-17 DIAGNOSIS — Z20822 Contact with and (suspected) exposure to covid-19: Secondary | ICD-10-CM | POA: Insufficient documentation

## 2023-05-17 DIAGNOSIS — R0981 Nasal congestion: Secondary | ICD-10-CM | POA: Insufficient documentation

## 2023-05-17 LAB — RESP PANEL BY RT-PCR (RSV, FLU A&B, COVID)  RVPGX2
Influenza A by PCR: NEGATIVE
Influenza B by PCR: NEGATIVE
Resp Syncytial Virus by PCR: POSITIVE — AB
SARS Coronavirus 2 by RT PCR: NEGATIVE

## 2023-05-17 MED ORDER — BENZONATATE 100 MG PO CAPS
100.0000 mg | ORAL_CAPSULE | Freq: Three times a day (TID) | ORAL | 0 refills | Status: AC | PRN
Start: 2023-05-17 — End: 2024-05-16

## 2023-05-17 NOTE — ED Provider Triage Note (Signed)
 Emergency Medicine Provider Triage Evaluation Note  Roberto Hill. , a 38 y.o. male  was evaluated in triage.  Pt complains of cough x 3 days. Children at home have same sx. Denies fever, vomiting, chest pain and SOB. Patient requesting work note.   Review of Systems  Positive:  Negative:   Physical Exam  BP 133/79 (BP Location: Left Arm)   Pulse 62   Temp 98.1 F (36.7 C) (Oral)   Resp 17   Ht 5' 6 (1.676 m)   Wt 77.1 kg   BMI 27.44 kg/m  Gen:   Awake, no distress   Resp:  Normal effort; LCTAB MSK:   Moves extremities without difficulty  Other:    Medical Decision Making  Medically screening exam initiated at 6:15 PM.  Appropriate orders placed.  Roberto Hill. was informed that the remainder of the evaluation will be completed by another provider, this initial triage assessment does not replace that evaluation, and the importance of remaining in the ED until their evaluation is complete.     Margrette, Zyara Riling A, PA-C 05/17/23 1816

## 2023-05-17 NOTE — ED Provider Notes (Signed)
 [facility] Emergency Department Provider Note     Event Date/Time   First MD Initiated Contact with Patient 05/17/23 2114     (approximate)   History   URI   HPI  Roberto R Marius Betts. is a 38 y.o. male with a history of asthma presents to the ED with complaint of cough and congestion x 3 days.  Patient reports daughters at home have had same symptoms of cough and the common cold.  Has taken Tylenol with minimal relief.  Denies fever, vomiting, chest pain or shortness of breath.     Physical Exam   Triage Vital Signs: ED Triage Vitals  Encounter Vitals Group     BP 05/17/23 1814 133/79     Systolic BP Percentile --      Diastolic BP Percentile --      Pulse Rate 05/17/23 1814 62     Resp 05/17/23 1814 17     Temp 05/17/23 1814 98.1 F (36.7 C)     Temp Source 05/17/23 1814 Oral     SpO2 05/17/23 1815 97 %     Weight 05/17/23 1814 170 lb (77.1 kg)     Height 05/17/23 1814 5' 6 (1.676 m)     Head Circumference --      Peak Flow --      Pain Score 05/17/23 1814 3     Pain Loc --      Pain Education --      Exclude from Growth Chart --     Most recent vital signs: Vitals:   05/17/23 1814 05/17/23 1815  BP: 133/79   Pulse: 62   Resp: 17   Temp: 98.1 F (36.7 C)   SpO2:  97%    General: Well appearing. Alert and oriented. INAD.  Skin:  Warm, dry and intact. No rashes or lesions noted.     Head:  NCAT.  Eyes:  PERRLA. EOMI.  Nose:   Mucosa is moist. No rhinorrhea. Throat: Oropharynx clear. No erythema or exudates. Tonsils not enlarged. Uvula is midline. Neck:   No cervical lymphadenopathy CV:  Good peripheral perfusion. RRR.  RESP:  Normal effort. LCTAB. No retractions.  ABD:  No distention. Soft, Non tender.  NEURO: Cranial nerves II-XII intact. No focal deficits.   ED Results / Procedures / Treatments   Labs (all labs ordered are listed, but only abnormal results are displayed) Labs Reviewed  RESP PANEL BY RT-PCR (RSV, FLU  A&B, COVID)  RVPGX2 - Abnormal; Notable for the following components:      Result Value   Resp Syncytial Virus by PCR POSITIVE (*)    All other components within normal limits   No results found.  PROCEDURES:  Critical Care performed: No  Procedures   MEDICATIONS ORDERED IN ED: Medications - No data to display   IMPRESSION / MDM / ASSESSMENT AND PLAN / ED COURSE  I reviewed the triage vital signs and the nursing notes.                               38 y.o. male presents to the emergency department for evaluation and treatment of acute cough. See HPI for further details.   Differential diagnosis includes, but is not limited to viral URI, COVID, influenza  Patient's presentation is most consistent with acute complicated illness / injury requiring diagnostic workup.   Patient is alert and oriented.  He is  hemodynamically stable and afebrile.  Physical exam findings are benign.  No indication for bacterial source.  Patient is well-appearing.  Respiratory panel was positive for RSV.  Symptomatic treatment at home provided.  Work note provided.  Patient is in stable condition for discharge home.  ED return precautions discussed.  Encouraged to follow-up with primary care as needed.    FINAL CLINICAL IMPRESSION(S) / ED DIAGNOSES   Final diagnoses:  RSV (respiratory syncytial virus infection)   Rx / DC Orders   ED Discharge Orders          Ordered    benzonatate  (TESSALON  PERLES) 100 MG capsule  3 times daily PRN        05/17/23 2133             Note:  This document was prepared using Dragon voice recognition software and may include unintentional dictation errors.    Margrette, Koby Pickup A, PA-C 05/17/23 2147    Dicky Anes, MD 05/17/23 [phone]

## 2023-05-17 NOTE — ED Notes (Signed)
 Patient discharged from ED by provider. Discharge instructions reviewed with patient and all questions answered. Patient ambulatory from ED in NAD.

## 2023-05-17 NOTE — ED Triage Notes (Signed)
 Pt sts that he has been having cough congestion with a runny nose and he is not ready to go back to work. Pt sts that he is feeling a little better.

## 2023-05-17 NOTE — Discharge Instructions (Addendum)
 You were evaluated in the ED for cough.  Your respiratory panel is positive for RSV.  A viral cough may last up to 2-3 weeks.   Take tylenol or ibuprofen  for pain or fever as directed.   Stay hydrated by drinking plenty of fluids to thin mucus. Get adequate amount of sleep and avoid overexertion. Consider a humidifier at night. Warm teas and a spoonful of honey may help reduce cough frequency. Follow up with your primary care provider as needed.
